# Patient Record
Sex: Female | Born: 1970
Health system: Southern US, Community
[De-identification: ages and names within clinical notes are randomized; demographics above are authoritative.]

## PROBLEM LIST (undated history)

## (undated) DIAGNOSIS — E785 Hyperlipidemia, unspecified: Secondary | ICD-10-CM

## (undated) DIAGNOSIS — M8580 Other specified disorders of bone density and structure, unspecified site: Secondary | ICD-10-CM

## (undated) DIAGNOSIS — M859 Disorder of bone density and structure, unspecified: Secondary | ICD-10-CM

## (undated) DIAGNOSIS — Z7982 Long term (current) use of aspirin: Secondary | ICD-10-CM

## (undated) DIAGNOSIS — I10 Essential (primary) hypertension: Secondary | ICD-10-CM

## (undated) DIAGNOSIS — A389 Scarlet fever, uncomplicated: Secondary | ICD-10-CM

## (undated) DIAGNOSIS — K760 Fatty (change of) liver, not elsewhere classified: Secondary | ICD-10-CM

## (undated) DIAGNOSIS — R7989 Other specified abnormal findings of blood chemistry: Secondary | ICD-10-CM

## (undated) DIAGNOSIS — I2510 Atherosclerotic heart disease of native coronary artery without angina pectoris: Secondary | ICD-10-CM

## (undated) DIAGNOSIS — M2041 Other hammer toe(s) (acquired), right foot: Secondary | ICD-10-CM

## (undated) DIAGNOSIS — M810 Age-related osteoporosis without current pathological fracture: Secondary | ICD-10-CM

## (undated) DIAGNOSIS — M21619 Bunion of unspecified foot: Secondary | ICD-10-CM

## (undated) DIAGNOSIS — M2011 Hallux valgus (acquired), right foot: Secondary | ICD-10-CM

## (undated) DIAGNOSIS — I5022 Chronic systolic (congestive) heart failure: Secondary | ICD-10-CM

## (undated) DIAGNOSIS — Z7901 Long term (current) use of anticoagulants: Secondary | ICD-10-CM

## (undated) DIAGNOSIS — Q969 Turner's syndrome, unspecified: Secondary | ICD-10-CM

## (undated) DIAGNOSIS — K8020 Calculus of gallbladder without cholecystitis without obstruction: Secondary | ICD-10-CM

## (undated) DIAGNOSIS — M9689 Other intraoperative and postprocedural complications and disorders of the musculoskeletal system: Secondary | ICD-10-CM

## (undated) DIAGNOSIS — R7303 Prediabetes: Secondary | ICD-10-CM

## (undated) HISTORY — PX: BREAST REDUCTION SURGERY: SHX8

## (undated) HISTORY — PX: HIP FRACTURE SURGERY: SHX118

---

## 2000-02-19 DIAGNOSIS — S72009A Fracture of unspecified part of neck of unspecified femur, initial encounter for closed fracture: Secondary | ICD-10-CM

## 2000-02-19 HISTORY — DX: Fracture of unspecified part of neck of unspecified femur, initial encounter for closed fracture: S72.009A

## 2006-06-04 ENCOUNTER — Encounter: Admission: RE | Admit: 2006-06-04 | Discharge: 2006-06-04 | Payer: Self-pay | Admitting: Obstetrics & Gynecology

## 2019-01-19 DIAGNOSIS — I219 Acute myocardial infarction, unspecified: Secondary | ICD-10-CM

## 2019-01-19 HISTORY — DX: Acute myocardial infarction, unspecified: I21.9

## 2019-04-30 ENCOUNTER — Ambulatory Visit: Payer: BC Managed Care – PPO | Attending: Internal Medicine

## 2019-04-30 DIAGNOSIS — Z23 Encounter for immunization: Secondary | ICD-10-CM

## 2019-04-30 NOTE — Progress Notes (Signed)
   Covid-19 Vaccination Clinic  Name:  Penny Nash    MRN: [id number] DOB: 06-05-1970  04/30/2019  Penny Nash was observed post Covid-19 immunization for 15 minutes without incident. She was provided with Vaccine Information Sheet and instruction to access the V-Safe system.   Penny Nash was instructed to call 911 with any severe reactions post vaccine: Marland Kitchen Difficulty breathing  . Swelling of face and throat  . A fast heartbeat  . A bad rash all over body  . Dizziness and weakness   Immunizations Administered    Name Date Dose VIS Date Route   Pfizer COVID-19 Vaccine 04/30/2019  9:59 AM 0.3 mL 01/29/2019 Intramuscular   Manufacturer: [employer], [employer]   Lot: [id number]   NDC: [address]-7

## 2019-05-24 ENCOUNTER — Ambulatory Visit: Payer: BC Managed Care – PPO | Attending: Internal Medicine

## 2019-05-24 DIAGNOSIS — Z23 Encounter for immunization: Secondary | ICD-10-CM

## 2019-05-24 NOTE — Progress Notes (Signed)
   Covid-19 Vaccination Clinic  Name:  Penny Nash    MRN: [id number] DOB: [birth year]  05/24/2019  [name] was observed post Covid-19 immunization for 15 minutes without incident. She was provided with Vaccine Information Sheet and instruction to access the V-Safe system.   [name] was instructed to call 911 with any severe reactions post vaccine: Marland Kitchen Difficulty breathing  . Swelling of face and throat  . A fast heartbeat  . A bad rash all over body  . Dizziness and weakness   Immunizations Administered    Name Date Dose VIS Date Route   Pfizer COVID-19 Vaccine 05/24/2019  1:43 PM 0.3 mL 01/29/2019 Intramuscular   Manufacturer: [employer], [employer]   Lot: [id number]   NDC: [address]-9

## 2020-01-19 DIAGNOSIS — I255 Ischemic cardiomyopathy: Secondary | ICD-10-CM

## 2020-01-19 HISTORY — DX: Ischemic cardiomyopathy: I25.5

## 2020-02-11 ENCOUNTER — Inpatient Hospital Stay (HOSPITAL_COMMUNITY)
Admission: EM | Disposition: A | Discharge status: Home / Self Care | Payer: Self-pay | Source: Home / Self Care | Attending: Interventional Cardiology

## 2020-02-11 ENCOUNTER — Emergency Department (HOSPITAL_COMMUNITY): Payer: BC Managed Care – PPO

## 2020-02-11 ENCOUNTER — Inpatient Hospital Stay (HOSPITAL_COMMUNITY)
Admission: EM | Admit: 2020-02-11 | Discharge: 2020-02-14 | DRG: 247 | Disposition: A | Discharge status: Home / Self Care | Payer: BC Managed Care – PPO | Attending: Interventional Cardiology | Admitting: Interventional Cardiology

## 2020-02-11 ENCOUNTER — Encounter (HOSPITAL_COMMUNITY): Payer: Self-pay | Admitting: Obstetrics and Gynecology

## 2020-02-11 ENCOUNTER — Other Ambulatory Visit: Payer: Self-pay

## 2020-02-11 DIAGNOSIS — I5022 Chronic systolic (congestive) heart failure: Secondary | ICD-10-CM

## 2020-02-11 DIAGNOSIS — I213 ST elevation (STEMI) myocardial infarction of unspecified site: Secondary | ICD-10-CM | POA: Diagnosis present

## 2020-02-11 DIAGNOSIS — E785 Hyperlipidemia, unspecified: Secondary | ICD-10-CM | POA: Diagnosis present

## 2020-02-11 DIAGNOSIS — Z20822 Contact with and (suspected) exposure to covid-19: Secondary | ICD-10-CM | POA: Diagnosis present

## 2020-02-11 DIAGNOSIS — I255 Ischemic cardiomyopathy: Secondary | ICD-10-CM | POA: Diagnosis present

## 2020-02-11 DIAGNOSIS — Q969 Turner's syndrome, unspecified: Secondary | ICD-10-CM

## 2020-02-11 DIAGNOSIS — I2109 ST elevation (STEMI) myocardial infarction involving other coronary artery of anterior wall: Principal | ICD-10-CM | POA: Diagnosis present

## 2020-02-11 DIAGNOSIS — I2510 Atherosclerotic heart disease of native coronary artery without angina pectoris: Secondary | ICD-10-CM | POA: Diagnosis present

## 2020-02-11 DIAGNOSIS — I2102 ST elevation (STEMI) myocardial infarction involving left anterior descending coronary artery: Secondary | ICD-10-CM

## 2020-02-11 DIAGNOSIS — Z955 Presence of coronary angioplasty implant and graft: Secondary | ICD-10-CM

## 2020-02-11 HISTORY — PX: LEFT HEART CATH AND CORONARY ANGIOGRAPHY: CATH118249

## 2020-02-11 HISTORY — DX: ST elevation (STEMI) myocardial infarction involving other coronary artery of anterior wall: I21.09

## 2020-02-11 HISTORY — DX: Scarlet fever, uncomplicated: A38.9

## 2020-02-11 HISTORY — DX: Other specified disorders of bone density and structure, unspecified site: M85.80

## 2020-02-11 HISTORY — DX: Disorder of bone density and structure, unspecified: M85.9

## 2020-02-11 HISTORY — PX: CORONARY/GRAFT ACUTE MI REVASCULARIZATION: CATH118305

## 2020-02-11 HISTORY — PX: INTRAVASCULAR ULTRASOUND/IVUS: CATH118244

## 2020-02-11 HISTORY — PX: CORONARY ULTRASOUND/IVUS: CATH118244

## 2020-02-11 HISTORY — DX: Turner's syndrome, unspecified: Q96.9

## 2020-02-11 LAB — PROTIME-INR
INR: 1 (ref 0.8–1.2)
Prothrombin Time: 13 seconds (ref 11.4–15.2)

## 2020-02-11 LAB — LIPID PANEL
Cholesterol: 207 mg/dL — ABNORMAL HIGH (ref 0–200)
HDL: 71 mg/dL (ref >40)
LDL Cholesterol: 128 mg/dL — ABNORMAL HIGH (ref 0–99)
Total CHOL/HDL Ratio: 2.9 RATIO
Triglycerides: 42 mg/dL (ref <150)
VLDL: 8 mg/dL (ref 0–40)

## 2020-02-11 LAB — MRSA PCR SCREENING: MRSA by PCR: NEGATIVE

## 2020-02-11 LAB — RESP PANEL BY RT-PCR (FLU A&B, COVID) ARPGX2
Influenza A by PCR: NEGATIVE
Influenza B by PCR: NEGATIVE
SARS Coronavirus 2 by RT PCR: NEGATIVE

## 2020-02-11 LAB — CBC
HCT: 44.8 % (ref 36.0–46.0)
Hemoglobin: 15.2 g/dL — ABNORMAL HIGH (ref 12.0–15.0)
MCH: 31.6 pg (ref 26.0–34.0)
MCHC: 33.9 g/dL (ref 30.0–36.0)
MCV: 93.1 fL (ref 80.0–100.0)
Platelets: 281 10*3/uL (ref 150–400)
RBC: 4.81 MIL/uL (ref 3.87–5.11)
RDW: 12.1 % (ref 11.5–15.5)
WBC: 17.6 10*3/uL — ABNORMAL HIGH (ref 4.0–10.5)
nRBC: 0 % (ref 0.0–0.2)

## 2020-02-11 LAB — BASIC METABOLIC PANEL
Anion gap: 11 (ref 5–15)
BUN: 12 mg/dL (ref 6–20)
CO2: 26 mmol/L (ref 22–32)
Calcium: 10 mg/dL (ref 8.9–10.3)
Chloride: 101 mmol/L (ref 98–111)
Creatinine, Ser: 0.76 mg/dL (ref 0.44–1.00)
GFR, Estimated: >60 mL/min (ref >60)
Glucose, Bld: 169 mg/dL — ABNORMAL HIGH (ref 70–99)
Potassium: 3.9 mmol/L (ref 3.5–5.1)
Sodium: 138 mmol/L (ref 135–145)

## 2020-02-11 LAB — HEMOGLOBIN A1C
Hgb A1c MFr Bld: 5.6 % (ref 4.8–5.6)
Mean Plasma Glucose: 114.02 mg/dL

## 2020-02-11 LAB — TROPONIN I (HIGH SENSITIVITY)
Troponin I (High Sensitivity): 13067 ng/L (ref <18)
Troponin I (High Sensitivity): 15101 ng/L (ref <18)

## 2020-02-11 LAB — APTT: aPTT: 26 seconds (ref 24–36)

## 2020-02-11 SURGERY — CORONARY/GRAFT ACUTE MI REVASCULARIZATION
Anesthesia: LOCAL

## 2020-02-11 MED ORDER — TIROFIBAN HCL IN NACL 5-0.9 MG/100ML-% IV SOLN: INTRAVENOUS | Status: DC | PRN

## 2020-02-11 MED ORDER — ATORVASTATIN CALCIUM 80 MG PO TABS
80.0000 mg | ORAL_TABLET | Freq: Every day | ORAL | Status: DC
  Administered 2020-02-11 – 2020-02-14 (×4): 80 mg via ORAL
  Filled 2020-02-11 (×4): qty 1

## 2020-02-11 MED ORDER — NITROGLYCERIN 0.4 MG SL SUBL
0.4000 mg | SUBLINGUAL_TABLET | SUBLINGUAL | Status: DC | PRN
  Administered 2020-02-11: 0.4 mg via SUBLINGUAL
  Filled 2020-02-11: qty 1

## 2020-02-11 MED ORDER — HEPARIN (PORCINE) IN NACL 1000-0.9 UT/500ML-% IV SOLN
INTRAVENOUS | Status: AC
  Filled 2020-02-11: qty 1000

## 2020-02-11 MED ORDER — VERAPAMIL HCL 2.5 MG/ML IV SOLN
INTRAVENOUS | Status: DC | PRN
  Administered 2020-02-11: 17:00:00 10 mL via INTRA_ARTERIAL

## 2020-02-11 MED ORDER — ACETAMINOPHEN 325 MG PO TABS: 650.0000 mg | ORAL_TABLET | ORAL | Status: DC | PRN

## 2020-02-11 MED ORDER — HEPARIN SODIUM (PORCINE) 1000 UNIT/ML IJ SOLN
INTRAMUSCULAR | Status: AC
  Filled 2020-02-11: qty 1

## 2020-02-11 MED ORDER — VERAPAMIL HCL 2.5 MG/ML IV SOLN
INTRAVENOUS | Status: AC
  Filled 2020-02-11: qty 2

## 2020-02-11 MED ORDER — TIROFIBAN (AGGRASTAT) BOLUS VIA INFUSION
INTRAVENOUS | Status: DC | PRN
  Administered 2020-02-11: 18:00:00 1360 ug via INTRAVENOUS

## 2020-02-11 MED ORDER — IOHEXOL 350 MG/ML SOLN
INTRAVENOUS | Status: DC | PRN
  Administered 2020-02-11: 18:00:00 150 mL

## 2020-02-11 MED ORDER — HEPARIN SODIUM (PORCINE) 1000 UNIT/ML IJ SOLN
INTRAMUSCULAR | Status: DC | PRN
  Administered 2020-02-11: 6000 [IU] via INTRAVENOUS
  Administered 2020-02-11: 3000 [IU] via INTRAVENOUS

## 2020-02-11 MED ORDER — SODIUM CHLORIDE 0.9 % IV SOLN: 250.0000 mL | INTRAVENOUS | Status: DC | PRN

## 2020-02-11 MED ORDER — ASPIRIN 81 MG PO CHEW
81.0000 mg | CHEWABLE_TABLET | Freq: Every day | ORAL | Status: DC
  Administered 2020-02-12 – 2020-02-14 (×3): 81 mg via ORAL
  Filled 2020-02-11 (×3): qty 1

## 2020-02-11 MED ORDER — HEPARIN SODIUM (PORCINE) 5000 UNIT/ML IJ SOLN
60.0000 [IU]/kg | Freq: Once | INTRAMUSCULAR | Status: AC
  Administered 2020-02-11: 3250 [IU] via INTRAVENOUS
  Filled 2020-02-11: qty 1

## 2020-02-11 MED ORDER — LIDOCAINE HCL (PF) 1 % IJ SOLN
INTRAMUSCULAR | Status: DC | PRN
  Administered 2020-02-11: 5 mL

## 2020-02-11 MED ORDER — HYDRALAZINE HCL 20 MG/ML IJ SOLN: 10.0000 mg | INTRAMUSCULAR | Status: DC | PRN

## 2020-02-11 MED ORDER — HEPARIN (PORCINE) IN NACL 1000-0.9 UT/500ML-% IV SOLN
INTRAVENOUS | Status: DC | PRN
  Administered 2020-02-11 (×2): 500 mL

## 2020-02-11 MED ORDER — SODIUM CHLORIDE 0.9% FLUSH
3.0000 mL | Freq: Two times a day (BID) | INTRAVENOUS | Status: DC
  Administered 2020-02-11 – 2020-02-14 (×6): 3 mL via INTRAVENOUS

## 2020-02-11 MED ORDER — FENTANYL CITRATE (PF) 100 MCG/2ML IJ SOLN
INTRAMUSCULAR | Status: AC
  Filled 2020-02-11: qty 2

## 2020-02-11 MED ORDER — MIDAZOLAM HCL 2 MG/2ML IJ SOLN
INTRAMUSCULAR | Status: AC
  Filled 2020-02-11: qty 2

## 2020-02-11 MED ORDER — TICAGRELOR 90 MG PO TABS
ORAL_TABLET | ORAL | Status: AC
  Filled 2020-02-11: qty 2

## 2020-02-11 MED ORDER — SODIUM CHLORIDE 0.9 % IV SOLN: INTRAVENOUS | Status: DC

## 2020-02-11 MED ORDER — ASPIRIN 81 MG PO CHEW
324.0000 mg | CHEWABLE_TABLET | Freq: Once | ORAL | Status: AC
  Administered 2020-02-11: 324 mg via ORAL
  Filled 2020-02-11: qty 4

## 2020-02-11 MED ORDER — TICAGRELOR 90 MG PO TABS
90.0000 mg | ORAL_TABLET | Freq: Two times a day (BID) | ORAL | Status: DC
  Administered 2020-02-12 – 2020-02-14 (×5): 90 mg via ORAL
  Filled 2020-02-11 (×6): qty 1

## 2020-02-11 MED ORDER — SODIUM CHLORIDE 0.9% FLUSH: 3.0000 mL | INTRAVENOUS | Status: DC | PRN

## 2020-02-11 MED ORDER — FENTANYL CITRATE (PF) 100 MCG/2ML IJ SOLN
INTRAMUSCULAR | Status: DC | PRN
  Administered 2020-02-11 (×2): 25 ug via INTRAVENOUS

## 2020-02-11 MED ORDER — NITROGLYCERIN 1 MG/10 ML FOR IR/CATH LAB
INTRA_ARTERIAL | Status: DC | PRN
  Administered 2020-02-11 (×2): 200 ug

## 2020-02-11 MED ORDER — ONDANSETRON HCL 4 MG/2ML IJ SOLN: 4.0000 mg | Freq: Four times a day (QID) | INTRAMUSCULAR | Status: DC | PRN

## 2020-02-11 MED ORDER — LABETALOL HCL 5 MG/ML IV SOLN: 10.0000 mg | INTRAVENOUS | Status: DC | PRN

## 2020-02-11 MED ORDER — MIDAZOLAM HCL 2 MG/2ML IJ SOLN
INTRAMUSCULAR | Status: DC | PRN
  Administered 2020-02-11 (×2): 1 mg via INTRAVENOUS

## 2020-02-11 MED ORDER — LIDOCAINE HCL (PF) 1 % IJ SOLN
INTRAMUSCULAR | Status: AC
  Filled 2020-02-11: qty 30

## 2020-02-11 MED ORDER — METOPROLOL TARTRATE 12.5 MG HALF TABLET
12.5000 mg | ORAL_TABLET | Freq: Two times a day (BID) | ORAL | Status: DC
  Administered 2020-02-11 – 2020-02-12 (×3): 12.5 mg via ORAL
  Filled 2020-02-11 (×3): qty 1

## 2020-02-11 MED ORDER — TIROFIBAN HCL IN NACL 5-0.9 MG/100ML-% IV SOLN
INTRAVENOUS | Status: AC
  Filled 2020-02-11: qty 100

## 2020-02-11 MED ORDER — TICAGRELOR 90 MG PO TABS
ORAL_TABLET | ORAL | Status: DC | PRN
  Administered 2020-02-11: 180 mg via ORAL

## 2020-02-11 SURGICAL SUPPLY — 26 items
BALLN SAPPHIRE 2.5X12 (BALLOONS) ×2
BALLN SAPPHIRE ~~LOC~~ 2.75X12 (BALLOONS) ×1 IMPLANT
BALLN SAPPHIRE ~~LOC~~ 3.75X8 (BALLOONS) ×1 IMPLANT
BALLN WOLVERINE 2.00X15 (BALLOONS) ×2
BALLOON SAPPHIRE 2.5X12 (BALLOONS) IMPLANT
BALLOON WOLVERINE 2.00X15 (BALLOONS) IMPLANT
CATH 5FR JL3.5 JR4 ANG PIG MP (CATHETERS) ×1 IMPLANT
CATH EXTRAC PRONTO 5.5F 138CM (CATHETERS) ×1 IMPLANT
CATH LAUNCHER 6FR EBU 3 (CATHETERS) ×1 IMPLANT
CATH LAUNCHER 6FR EBU3.5 (CATHETERS) ×1 IMPLANT
CATH OPTICROSS HD (CATHETERS) ×1 IMPLANT
DEVICE RAD COMP TR BAND LRG (VASCULAR PRODUCTS) ×1 IMPLANT
GUIDEWIRE INQWIRE 1.5J.035X260 (WIRE) IMPLANT
INQWIRE 1.5J .035X260CM (WIRE) ×4
KIT HEART LEFT (KITS) ×2 IMPLANT
PACK CARDIAC CATHETERIZATION (CUSTOM PROCEDURE TRAY) ×2 IMPLANT
SHEATH PROBE COVER 6X72 (BAG) ×1 IMPLANT
SLED PULL BACK IVUS (MISCELLANEOUS) ×1 IMPLANT
STENT SYNERGY XD 2.25X24 (Permanent Stent) IMPLANT
STENT SYNERGY XD 3.50X12 (Permanent Stent) IMPLANT
SYNERGY XD 2.25X24 (Permanent Stent) ×2 IMPLANT
SYNERGY XD 3.50X12 (Permanent Stent) ×2 IMPLANT
TRANSDUCER W/STOPCOCK (MISCELLANEOUS) ×2 IMPLANT
TUBING CIL FLEX 10 FLL-RA (TUBING) ×2 IMPLANT
WIRE ASAHI PROWATER 180CM (WIRE) ×1 IMPLANT
WIRE HI TORQ BMW 190CM (WIRE) ×1 IMPLANT

## 2020-02-11 NOTE — ED Notes (Signed)
Code Stemi called @ 16:18 by Deatra Robinson.

## 2020-02-11 NOTE — ED Notes (Signed)
Critical troponin: 15,101 called by lab. [facility]. Lab values pushed to IP at this time.

## 2020-02-11 NOTE — H&P (Addendum)
Cardiology Admission History and Physical:   Patient ID: Penny Nash MRN: [id number]; DOB: 10/21/1970   Admission date: 02/11/2020  Primary Care Provider: Macy Mis, MD [facility] Cardiologist: No primary care provider on file. 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  It has been fairly constant but better now than it was earlier.  She came to the emergency room.  Initial ECG did not concern them for STEMI.  Her troponin came back and repeat ECG showed worsening ST segments.  Code STEMI was called.  Her pain actually improved over time.  She has been given some sublingual nitroglycerin.  She has not had any family history of coronary artery disease.  Overall, she is quite healthy.   Past Medical History:  Diagnosis Date  . Hip fracture (HCC) 2002  . Turner syndrome       Medications Prior to Admission: Prior to Admission medications   Not on File     Allergies:   No Known Allergies  Social History:   Social History   Socioeconomic History  . Marital status: Married    Spouse name: Not on file  . Number of children: Not on file  . Years of education: Not on file  . Highest education level: Not on file  Occupational History  . Not on file  Tobacco Use  . Smoking status: Never Smoker  . Smokeless tobacco: Never Used  Vaping Use  . Vaping Use: Never used  Substance and Sexual Activity  . Alcohol use: Yes    Comment: Social  . Drug use: Not Currently  . Sexual activity: Yes  Other Topics Concern  . Not on file  Social History Narrative  . Not on file   Social Determinants of Health   Financial Resource Strain: Not on file  Food Insecurity: Not on file  Transportation Needs: Not on file  Physical Activity: Not on file  Stress: Not  on file  Social Connections: Not on file  Intimate Partner Violence: Not on file    Family History:   The patient's family history is not on file.  Sister with mitral valve repair  ROS:  Please see the history of present illness.  Chest discomfort, 2 out of 10.  All other ROS reviewed and negative.     Physical Exam/Data:   Vitals:   02/11/20 1818 02/11/20 1823 02/11/20 1833 02/11/20 1842  BP: 119/85 118/83  104/85  Pulse: 80 78 (!) 0   Resp: 20 15 (!) 0 20  Temp:      TempSrc:      SpO2: 99% 100% (!) 0%   Weight:      Height:       No intake or output data in the 24 hours ending 02/11/20 1854 Last 3 Weights 02/11/2020  Weight (lbs) 120 lb  Weight (kg) 54.432 kg     Body mass index is 25.97 kg/m.  General:  Well nourished, well developed, in no acute distress HEENT: normal Lymph: no adenopathy Neck: no JVD Endocrine:  No thryomegaly Vascular: No carotid bruits; dorsalis pedis, radial pulses 2+ bilaterally Cardiac:  normal S1, S2; RRR; no murmur  Lungs:  clear to auscultation bilaterally, no wheezing, rhonchi or rales  Abd: soft, nontender, no hepatomegaly  Ext:  no edema Musculoskeletal:  No deformities, BUE and BLE strength normal and equal Skin: warm and dry  Neuro:  CNs 2-12 intact, no focal abnormalities noted Psych:  Normal affect    EKG:  The ECG that was done today was personally reviewed and demonstrates normal sinus rhythm with anterolateral ST elevation and inferior ST reciprocal depression  Relevant CV Studies:   Laboratory Data:  High Sensitivity Troponin:   Recent Labs  Lab 02/11/20 1500 02/11/20 1617  TROPONINIHS 13,067* 15,101*      Chemistry Recent Labs  Lab 02/11/20 1500  NA 138  K 3.9  CL 101  CO2 26  GLUCOSE 169*  BUN 12  CREATININE 0.76  CALCIUM 10.0  GFRNONAA >60  ANIONGAP 11    No results for input(s): PROT, ALBUMIN, AST, ALT, ALKPHOS, BILITOT in the last 168 hours. Hematology Recent Labs  Lab 02/11/20 1500  WBC  17.6*  RBC 4.81  HGB 15.2*  HCT 44.8  MCV 93.1  MCH 31.6  MCHC 33.9  RDW 12.1  PLT 281   BNPNo results for input(s): BNP, PROBNP in the last 168 hours.  DDimer No results for input(s): DDIMER in the last 168 hours.   Radiology/Studies:  DG Chest 2 View  Result Date: 02/11/2020 CLINICAL DATA:  Chest pain, arm tingling EXAM: CHEST - 2 VIEW COMPARISON:  X-ray thoracic spine 06/10/2014 FINDINGS: The heart size and mediastinal contours are within normal limits. No focal consolidation. No pulmonary edema. No pleural effusion. No pneumothorax. No acute osseous abnormality. 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 IMPRESSION: 1. No active cardiopulmonary disease. 2. 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 Recommend correlation with tenderness to palpation to evaluate for an underlying acute component. Electronically Signed   By: Tish Frederickson M.D.   On: 02/11/2020 15:07     Assessment and Plan:   1. Acute anterior MI: Plan for emergent cardiac catheterization.  She will need aggressive risk factor modification.  High-dose statin and beta-blocker.  She will need to be checked for diabetes as well.  Further plans based on cath result.  Some concern for aortic dissection given her Turner syndrome.  No murmur indicative of bicuspid aortic valve.  Equal pulses in both feet and both arms.  No clinical evidence of dissection and with elevated troponin, this seems more like ACS.  Addendum post cath: Patient with occluded mid LAD.  She had separate ostia of the LAD and circumflex.  Mid LAD was stented.  The distal LAD was diffusely diseased and calcified and a small vessel by intravascular ultrasound.  Proximal to ostial LAD was stented for more chronic lesion.  Large left circumflex with mild diffuse disease.  Small RCA.  Will check echocardiogram to evaluate LVEF.  She did not appear volume overloaded.   Anticipate she will be in the hospital for a couple of days.      TIMI Risk Score for ST  Elevation MI:   The patient's TIMI risk score is 5, which indicates a 12.4% risk of all cause mortality at 30 days.       Severity of Illness: The appropriate patient status for this patient is INPATIENT. Inpatient status is judged to be reasonable and necessary in order to provide the required intensity of service to ensure the patient's safety. The patient's presenting symptoms, physical exam findings, and initial radiographic and laboratory data in the context of their chronic comorbidities is felt to place them  at high risk for further clinical deterioration. Furthermore, it is not anticipated that the patient will be medically stable for discharge from the hospital within 2 midnights of admission. The following factors support the patient status of inpatient.   " The patient's presenting symptoms include chest pain. " The worrisome physical exam findings include n/a. " The initial radiographic and laboratory data are worrisome because of abnormal ECG. " The chronic co-morbidities include turners syndrome.   * I certify that at the point of admission it is my clinical judgment that the patient will require inpatient hospital care spanning beyond 2 midnights from the point of admission due to high intensity of service, high risk for further deterioration and high frequency of surveillance required.*    For questions or updates, please contact [facility] Please consult [email or web address] for contact info under     Signed, Lance Muss, MD  02/11/2020 6:54 PM

## 2020-02-11 NOTE — ED Triage Notes (Signed)
Patient reports that last night she started having chest tightness and "felt strange" and then she had tingling going down her right arm and up her jaw and had an episode of emesis.

## 2020-02-11 NOTE — ED Notes (Signed)
[facility] at bedside, report with no further questions.

## 2020-02-11 NOTE — ED Notes (Signed)
After NTG administration pt chest pain 6/10 down to 2/10.

## 2020-02-11 NOTE — ED Provider Notes (Signed)
[place] DEPT Provider Note   CSN: [id number] Arrival date & time: 02/11/20  1434     History Chief Complaint  Patient presents with  . Chest Pain    Penny Nash is a 49 y.o. female.  The history is provided by the patient.  Chest Pain Pain location:  R chest and substernal area Pain quality: aching, pressure and radiating   Pain radiates to:  R shoulder Pain severity:  Moderate Onset quality:  Gradual Duration:  7 hours Timing:  Constant Progression:  Unchanged Chronicity:  New Context comment:  Started before laying down last night to go to bed Relieved by:  Nothing Worsened by:  Nothing Ineffective treatments:  Antacids and aspirin Associated symptoms: anorexia, nausea and vomiting   Associated symptoms: no abdominal pain, no back pain, no cough, no diaphoresis, no fever, no headache, no heartburn, no palpitations and no shortness of breath   Associated symptoms comment:  Tingling in the right arm Risk factors comment:  Hx of turner's syndrome.  no tobacco, drug or alcohol use.  no family hx of early CAD      Past Medical History:  Diagnosis Date  . Hip fracture ([place]) 2002  . Turner syndrome     There are no problems to display for this patient.      OB History   No obstetric history on file.     No family history on file.  Social History   Tobacco Use  . Smoking status: Never Smoker  . Smokeless tobacco: Never Used  Vaping Use  . Vaping Use: Never used  Substance Use Topics  . Alcohol use: Yes    Comment: Social  . Drug use: Not Currently    Home Medications Prior to Admission medications   Not on File    Allergies    Patient has no allergy information on record.  Review of Systems   Review of Systems  Constitutional: Negative for diaphoresis and fever.  Respiratory: Negative for cough and shortness of breath.   Cardiovascular: Positive for chest pain. Negative for palpitations.   Gastrointestinal: Positive for anorexia, nausea and vomiting. Negative for abdominal pain and heartburn.  Musculoskeletal: Negative for back pain.  Neurological: Negative for headaches.  All other systems reviewed and are negative.   Physical Exam Updated Vital Signs BP (!) 137/95 (BP Location: Right Arm)   Pulse 73   Temp 98.1 F (36.7 C) (Oral)   Resp 16   SpO2 96%   Physical Exam Vitals and nursing note reviewed.  Constitutional:      General: She is not in acute distress.    Appearance: She is well-developed, normal weight and well-nourished.  HENT:     Head: Normocephalic and atraumatic.     Mouth/Throat:     Mouth: Oropharynx is clear and moist.  Eyes:     Extraocular Movements: EOM normal.     Conjunctiva/sclera: Conjunctivae normal.     Pupils: Pupils are equal, round, and reactive to light.  Cardiovascular:     Rate and Rhythm: Normal rate and regular rhythm.     Pulses: Normal pulses and intact distal pulses.     Heart sounds: Normal heart sounds. No murmur heard.   Pulmonary:     Effort: Pulmonary effort is normal. No respiratory distress.     Breath sounds: Normal breath sounds. No wheezing or rales.  Abdominal:     General: There is no distension.     Palpations: Abdomen is soft.  Tenderness: There is no abdominal tenderness. There is no guarding or rebound.  Musculoskeletal:        General: No tenderness. Normal range of motion.     Cervical back: Normal range of motion and neck supple.     Right lower leg: No edema.     Left lower leg: No edema.  Skin:    General: Skin is warm and dry.     Findings: No erythema or rash.  Neurological:     General: No focal deficit present.     Mental Status: She is alert and oriented to person, place, and time. Mental status is at baseline.  Psychiatric:        Mood and Affect: Mood and affect and mood normal.        Behavior: Behavior normal.        Thought Content: Thought content normal.      ED  Results / Procedures / Treatments   Labs (all labs ordered are listed, but only abnormal results are displayed) Labs Reviewed  BASIC METABOLIC PANEL - Abnormal; Notable for the following components:      Result Value   Glucose, Bld 169 (*)    All other components within normal limits  CBC - Abnormal; Notable for the following components:   WBC 17.6 (*)    Hemoglobin 15.2 (*)    All other components within normal limits  TROPONIN I (HIGH SENSITIVITY) - Abnormal; Notable for the following components:   Troponin I (High Sensitivity) 13,067 (*)    All other components within normal limits    EKG EKG Interpretation  Date/Time:  Friday February 11 2020 14:46:52 EST Ventricular Rate:  78 PR Interval:    QRS Duration: 90 QT Interval:  384 QTC Calculation: 438 R Axis:   103 Text Interpretation: Sinus rhythm Probable lateral infarct, old Probable anteroseptal infarct, recent 12 Lead; Mason-Likar Confirmed by Blanchie Dessert [phone]) on 02/11/2020 3:52:59 PM   Radiology DG Chest 2 View  Result Date: 02/11/2020 CLINICAL DATA:  Chest pain, arm tingling EXAM: CHEST - 2 VIEW COMPARISON:  X-ray thoracic spine 06/10/2014 FINDINGS: The heart size and mediastinal contours are within normal limits. No focal consolidation. No pulmonary edema. No pleural effusion. No pneumothorax. No acute osseous abnormality. 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 IMPRESSION: 1. No active cardiopulmonary disease. 2. 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 Recommend correlation with tenderness to palpation to evaluate for an underlying acute component. Electronically Signed   By: Iven Finn M.D.   On: 02/11/2020 15:07    Procedures Procedures (including critical care time)  Medications Ordered in ED Medications  aspirin chewable tablet 324 mg (has no administration in time range)  nitroGLYCERIN (NITROSTAT) SL  tablet 0.4 mg (has no administration in time range)    ED Course  I have reviewed the triage vital signs and the nursing notes.  Pertinent labs & imaging results that were available during my care of the patient were reviewed by me and considered in my medical decision making (see chart for details).    MDM Rules/Calculators/A&P                         Patient is a 49 year old female with no significant past medical history except for Turner syndrome who is presenting today with chest pain radiating into the right arm that started at 9 PM last night.  She reports  all night long it was uncomfortable and she was unable to go to sleep.  She did try Tums which caused her to vomit but did not help with the pain.  All day today she has had persistent chest discomfort that does not seem to be worse with any particular activity.  It is a 5 out of 10.  It does not radiate into her abdomen or her back.  She denies any shortness of breath or infectious symptoms.  Patient is still currently having 5 out of 10 pain.  She is well-appearing on exam vital signs are reassuring.  However patient's EKG is concerning with ST elevation of approximately 1 mm in lateral leads I and AV L as well as ST depression in inferior and anterior leads.  Patient intends are concerning for ACS.  No prior history of GI pathology.  Chest x-ray without acute findings.  Leukocytosis of 17,000 but normal hemoglobin.  BMP without acute findings.  Troponin elevated today at 13,000.  Patient given 325 of aspirin, nitroglycerin ordered due to ongoing pain.  Will repeat EKG and discuss with cardiology.  Patient was bedded in room at 345.  Initial EKG done was done at 2:46 PM.  Initial doctor evaluating the EKG did not feel that it met STEMI criteria.  I evaluated the EKG at 408 and felt EKG was concerning.  Labs then returned shortly after.  Will contact STEMI doc.  4:16 PM Repeat EKG is concerning for acute MI with ST elevation in 1 and aVL as  well as V2 with ST depression in inferior anterior/lateral leads.  STEMI activated.  Patient also given heparin.  Will discuss with cardiology and plan on transfer to [facility].  MDM Number of Diagnoses or Management Options   Amount and/or Complexity of Data Reviewed Clinical lab tests: ordered and reviewed Tests in the radiology section of CPT: ordered and reviewed Tests in the medicine section of CPT: ordered and reviewed Decide to obtain previous medical records or to obtain history from someone other than the patient: yes Obtain history from someone other than the patient: no Review and summarize past medical records: yes Discuss the patient with other providers: yes Independent visualization of images, tracings, or specimens: yes  Risk of Complications, Morbidity, and/or Mortality Presenting problems: high Diagnostic procedures: high Management options: high  Patient Progress Patient progress: stable  CRITICAL CARE Performed by: Keilen Kahl Total critical care time: 30 minutes Critical care time was exclusive of separately billable procedures and treating other patients. Critical care was necessary to treat or prevent imminent or life-threatening deterioration. Critical care was time spent personally by me on the following activities: development of treatment plan with patient and/or surrogate as well as nursing, discussions with consultants, evaluation of patient's response to treatment, examination of patient, obtaining history from patient or surrogate, ordering and performing treatments and interventions, ordering and review of laboratory studies, ordering and review of radiographic studies, pulse oximetry and re-evaluation of patient's condition.   Final Clinical Impression(s) / ED Diagnoses Final diagnoses:  ST elevation myocardial infarction (STEMI), unspecified artery [facility])    Rx / DC Orders ED Discharge Orders    None       Blanchie Dessert, MD 02/11/20  [phone]

## 2020-02-11 NOTE — ED Notes (Signed)
Date and time results received: 02/11/20 4:00 PM (use smartphrase ".now" to insert current time)  Test: troponin Critical Value: 13067.00  Name of Provider Notified: .[email or web address]  Orders Received? Or Actions Taken?:

## 2020-02-12 ENCOUNTER — Encounter (HOSPITAL_COMMUNITY): Payer: Self-pay | Admitting: Interventional Cardiology

## 2020-02-12 ENCOUNTER — Inpatient Hospital Stay (HOSPITAL_COMMUNITY): Payer: BC Managed Care – PPO

## 2020-02-12 DIAGNOSIS — Q969 Turner's syndrome, unspecified: Secondary | ICD-10-CM | POA: Diagnosis not present

## 2020-02-12 DIAGNOSIS — I2109 ST elevation (STEMI) myocardial infarction involving other coronary artery of anterior wall: Secondary | ICD-10-CM

## 2020-02-12 LAB — TROPONIN I (HIGH SENSITIVITY)
Troponin I (High Sensitivity): 19315 ng/L (ref <18)
Troponin I (High Sensitivity): 17940 ng/L (ref <18)

## 2020-02-12 LAB — BASIC METABOLIC PANEL
Anion gap: 10 (ref 5–15)
Anion gap: 12 (ref 5–15)
BUN: 10 mg/dL (ref 6–20)
BUN: 12 mg/dL (ref 6–20)
CO2: 25 mmol/L (ref 22–32)
CO2: 23 mmol/L (ref 22–32)
Calcium: 9 mg/dL (ref 8.9–10.3)
Calcium: 8.9 mg/dL (ref 8.9–10.3)
Chloride: 103 mmol/L (ref 98–111)
Chloride: 101 mmol/L (ref 98–111)
Creatinine, Ser: 0.73 mg/dL (ref 0.44–1.00)
Creatinine, Ser: 0.77 mg/dL (ref 0.44–1.00)
GFR, Estimated: >60 mL/min (ref >60)
GFR, Estimated: >60 mL/min (ref >60)
Glucose, Bld: 117 mg/dL — ABNORMAL HIGH (ref 70–99)
Glucose, Bld: 143 mg/dL — ABNORMAL HIGH (ref 70–99)
Potassium: 3.8 mmol/L (ref 3.5–5.1)
Potassium: 3.6 mmol/L (ref 3.5–5.1)
Sodium: 138 mmol/L (ref 135–145)
Sodium: 136 mmol/L (ref 135–145)

## 2020-02-12 LAB — CBC
HCT: 38.8 % (ref 36.0–46.0)
Hemoglobin: 13.2 g/dL (ref 12.0–15.0)
MCH: 31 pg (ref 26.0–34.0)
MCHC: 34 g/dL (ref 30.0–36.0)
MCV: 91.1 fL (ref 80.0–100.0)
Platelets: 200 10*3/uL (ref 150–400)
RBC: 4.26 MIL/uL (ref 3.87–5.11)
RDW: 12.5 % (ref 11.5–15.5)
WBC: 13.3 10*3/uL — ABNORMAL HIGH (ref 4.0–10.5)
nRBC: 0 % (ref 0.0–0.2)

## 2020-02-12 LAB — ECHOCARDIOGRAM COMPLETE
Area-P 1/2: 3.48 cm2
Calc EF: 44.3 %
Height: 57 in
S' Lateral: 2.7 cm
Single Plane A2C EF: 41 %
Single Plane A4C EF: 47.2 %
Weight: 1932.99 oz

## 2020-02-12 LAB — LIPID PANEL
Cholesterol: 162 mg/dL (ref 0–200)
HDL: 55 mg/dL (ref >40)
LDL Cholesterol: 94 mg/dL (ref 0–99)
Total CHOL/HDL Ratio: 2.9 RATIO
Triglycerides: 65 mg/dL (ref <150)
VLDL: 13 mg/dL (ref 0–40)

## 2020-02-12 LAB — HEMOGLOBIN A1C
Hgb A1c MFr Bld: 5.3 % (ref 4.8–5.6)
Mean Plasma Glucose: 105.41 mg/dL

## 2020-02-12 MED ORDER — CHLORHEXIDINE GLUCONATE CLOTH 2 % EX PADS
6.0000 | MEDICATED_PAD | Freq: Every day | CUTANEOUS | Status: DC
  Administered 2020-02-13 – 2020-02-14 (×2): 6 via TOPICAL

## 2020-02-12 NOTE — Plan of Care (Signed)

## 2020-02-12 NOTE — Progress Notes (Signed)
Cardiology Progress Note  Patient ID: Penny Nash MRN: [id number] DOB: [birth year] Date of Encounter: 02/12/2020  Primary Cardiologist: No primary care provider on file.  Subjective   Chief Complaint: No further chest pain.  HPI: Admitted with anterior wall STEMI. Status post PCI to the mid LAD. LVEDP 14. Denies chest pain. EKG with anterior Q waves and persistent ST elevation.  ROS:  All other ROS reviewed and negative. Pertinent positives noted in the HPI.     Inpatient Medications  Scheduled Meds:  aspirin  81 mg Oral Daily   atorvastatin  80 mg Oral Daily   Chlorhexidine Gluconate Cloth  6 each Topical Daily   metoprolol tartrate  12.5 mg Oral BID   sodium chloride flush  3 mL Intravenous Q12H   ticagrelor  90 mg Oral BID   Continuous Infusions:  sodium chloride     sodium chloride     PRN Meds: sodium chloride, acetaminophen, nitroGLYCERIN, ondansetron (ZOFRAN) IV, sodium chloride flush   Vital Signs   Vitals:   02/12/20 0530 02/12/20 0600 02/12/20 0700 02/12/20 0729  BP: 91/63 91/69 96/73    Pulse: 73 72 74   Resp: 18 18 19    Temp:    98.5 F (36.9 C)  TempSrc:    Oral  SpO2: 98% 98% 97%   Weight:  54.8 kg    Height:        Intake/Output Summary (Last 24 hours) at 02/12/2020 0758 Last data filed at 02/12/2020 0600 Gross per 24 hour  Intake 700 ml  Output 1150 ml  Net -450 ml   Last 3 Weights 02/12/2020 02/11/2020  Weight (lbs) 120 lb 13 oz 120 lb  Weight (kg) 54.8 kg 54.432 kg      Telemetry  Overnight telemetry shows sinus rhythm heart rate in the seventies, which I personally reviewed.   ECG  The most recent ECG shows normal sinus rhythm, anterior Q waves, persistent ST elevation, which I personally reviewed.   Physical Exam   Vitals:   02/12/20 0530 02/12/20 0600 02/12/20 0700 02/12/20 0729  BP: 91/63 91/69 96/73    Pulse: 73 72 74   Resp: 18 18 19    Temp:    98.5 F (36.9 C)  TempSrc:    Oral  SpO2: 98% 98% 97%    Weight:  54.8 kg    Height:         Intake/Output Summary (Last 24 hours) at 02/12/2020 0758 Last data filed at 02/12/2020 0600 Gross per 24 hour  Intake 700 ml  Output 1150 ml  Net -450 ml    Last 3 Weights 02/12/2020 02/11/2020  Weight (lbs) 120 lb 13 oz 120 lb  Weight (kg) 54.8 kg 54.432 kg    Body mass index is 26.14 kg/m.  General: Well nourished, well developed, in no acute distress Head: Atraumatic, normal size  Eyes: PEERLA, EOMI  Neck: Supple, no JVD Endocrine: No thryomegaly Cardiac: Normal S1, S2; RRR; no murmurs, rubs, or gallops Lungs: Clear to auscultation bilaterally, no wheezing, rhonchi or rales  Abd: Soft, nontender, no hepatomegaly  Ext: No edema, pulses 2+, right radial cath site clean and dry without hematoma or bruit Musculoskeletal: No deformities, BUE and BLE strength normal and equal Skin: Warm and dry, no rashes   Neuro: Alert and oriented to person, place, time, and situation, CNII-XII grossly intact, no focal deficits  Psych: Normal mood and affect   Labs  High Sensitivity Troponin:   Recent Labs  Lab 02/11/20 1500 02/11/20 1617  TROPONINIHS 13,067* 15,101*     Cardiac EnzymesNo results for input(s): TROPONINI in the last 168 hours. No results for input(s): TROPIPOC in the last 168 hours.  Chemistry Recent Labs  Lab 02/11/20 1500 02/12/20 0328  NA 138 138  K 3.9 3.8  CL 101 103  CO2 26 25  GLUCOSE 169* 117*  BUN 12 10  CREATININE 0.76 0.73  CALCIUM 10.0 9.0  GFRNONAA >60 >60  ANIONGAP 11 10    Hematology Recent Labs  Lab 02/11/20 1500 02/12/20 0328  WBC 17.6* 13.3*  RBC 4.81 4.26  HGB 15.2* 13.2  HCT 44.8 38.8  MCV 93.1 91.1  MCH 31.6 31.0  MCHC 33.9 34.0  RDW 12.1 12.5  PLT 281 200   BNPNo results for input(s): BNP, PROBNP in the last 168 hours.  DDimer No results for input(s): DDIMER in the last 168 hours.   Radiology  DG Chest 2 View  Result Date: 02/11/2020 CLINICAL DATA:  Chest pain, arm tingling EXAM:  CHEST - 2 VIEW COMPARISON:  X-ray thoracic spine 06/10/2014 FINDINGS: The heart size and mediastinal contours are within normal limits. No focal consolidation. No pulmonary edema. No pleural effusion. No pneumothorax. No acute osseous abnormality. 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 IMPRESSION: 1. No active cardiopulmonary disease. 2. 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 Recommend correlation with tenderness to palpation to evaluate for an underlying acute component. Electronically Signed   By: Tish Frederickson M.D.   On: 02/11/2020 15:07   CARDIAC CATHETERIZATION  Result Date: 02/11/2020  Separate ostia of the LAD and circumflex.  Mid LAD lesion is 100% stenosed.  A drug-eluting stent was successfully placed using a SYNERGY XD 2.25X24, postdilated to 2.9 mm and optimized with IVUS.  Post intervention, there is a 0% residual stenosis.  Ostial/Prox LAD lesion is 75% stenosed.  A drug-eluting stent was successfully placed using a SYNERGY XD 3.50X12, postdilated to 3.9 mm and optimized with IVUS.  Post intervention, there is a 0% residual stenosis.  Mid LAD to Dist LAD diffusely calcified, diseased.  There is mild left ventricular systolic dysfunction.  LV end diastolic pressure is normal.  The left ventricular ejection fraction is 45-50% by visual estimate.  There is no aortic valve stenosis.  Continue aggressive secondary prevention.  Beta blocker and high dose statin started.  Watch in ICU.  Check echocardiogram for evidence of LV thrombus.  Due to her late presentation, I would not be surprised if she does have some LV dysfunction.  She will need medical therapy to help improve LVEF.  No other targets for PCI as her mid to distal LAD is diffusely calcified and diseased.    Cardiac Studies  [facility] 02/11/2020  Separate ostia of the LAD and circumflex.  Mid LAD lesion is 100% stenosed.  A  drug-eluting stent was successfully placed using a SYNERGY XD 2.25X24, postdilated to 2.9 mm and optimized with IVUS.  Post intervention, there is a 0% residual stenosis.  Ostial/Prox LAD lesion is 75% stenosed.  A drug-eluting stent was successfully placed using a SYNERGY XD 3.50X12, postdilated to 3.9 mm and optimized with IVUS.  Post intervention, there is a 0% residual stenosis.  Mid LAD to Dist LAD diffusely calcified, diseased.  There is mild left ventricular systolic dysfunction.  LV end diastolic pressure is normal.  The left ventricular ejection fraction is 45-50% by visual estimate.  There is no aortic valve stenosis.   Continue  aggressive secondary prevention.  Beta blocker and high dose statin started.  Watch in ICU.  Check echocardiogram for evidence of LV thrombus.  Due to her late presentation, I would not be surprised if she does have some LV dysfunction.  She will need medical therapy to help improve LVEF.  No other targets for PCI as her mid to distal LAD is diffusely calcified and diseased.   Patient Profile  Penny Nash is a 49 y.o. female with Turner syndrome who was admitted on 02/11/2020 with anterior wall STEMI.  Assessment & Plan   1. Acute anterior wall STEMI -Admitted with 36 hours of pain. Taken to the Cath Lab and found to have occluded mid LAD. Status post PCI. -Continue aspirin Brilinta. -TSH for tomorrow. Echo pending. -Continue Lipitor 80 mg a day -We will see what her ejection fraction is. May need prophylactic Coumadin if she an akinetic apex. -She will remain in ICU today. Likely transfer to floor tomorrow. Remain on telemetry. -She will need likely an ACE and/or ARB pending on echo. -Continue metoprolol tartrate 12.5 mg twice daily  2. Turner syndrome -She will need monitoring of her aorta as an outpatient. No concerns for dissection per interventional cardiology yesterday.  FEN -Intravenous fluids post-cath -Heart healthy  diet -DVT PPx: Heparin -Code: Full  For questions or updates, please contact [facility] Please consult [email or web address] for contact info under   Time Spent with Patient: I have spent a total of 35 minutes with patient reviewing hospital notes, telemetry, EKGs, labs and examining the patient as well as establishing an assessment and plan that was discussed with the patient.  > 50% of time was spent in direct patient care.    Signed, Lenna Gilford. Flora Lipps, MD [facility]   [facility]  02/12/2020 7:58 AM

## 2020-02-12 NOTE — Progress Notes (Signed)
  Echocardiogram 2D Echocardiogram has been performed.  Delcie Roch 02/12/2020, 3:03 PM

## 2020-02-12 NOTE — Discharge Instructions (Signed)

## 2020-02-13 DIAGNOSIS — I2109 ST elevation (STEMI) myocardial infarction involving other coronary artery of anterior wall: Secondary | ICD-10-CM | POA: Diagnosis not present

## 2020-02-13 LAB — BASIC METABOLIC PANEL
Anion gap: 10 (ref 5–15)
BUN: 14 mg/dL (ref 6–20)
CO2: 24 mmol/L (ref 22–32)
Calcium: 8.9 mg/dL (ref 8.9–10.3)
Chloride: 102 mmol/L (ref 98–111)
Creatinine, Ser: 0.82 mg/dL (ref 0.44–1.00)
GFR, Estimated: >60 mL/min (ref >60)
Glucose, Bld: 118 mg/dL — ABNORMAL HIGH (ref 70–99)
Potassium: 4 mmol/L (ref 3.5–5.1)
Sodium: 136 mmol/L (ref 135–145)

## 2020-02-13 LAB — CBC
HCT: 37.1 % (ref 36.0–46.0)
Hemoglobin: 12.8 g/dL (ref 12.0–15.0)
MCH: 31.9 pg (ref 26.0–34.0)
MCHC: 34.5 g/dL (ref 30.0–36.0)
MCV: 92.5 fL (ref 80.0–100.0)
Platelets: 209 10*3/uL (ref 150–400)
RBC: 4.01 MIL/uL (ref 3.87–5.11)
RDW: 12.6 % (ref 11.5–15.5)
WBC: 11.8 10*3/uL — ABNORMAL HIGH (ref 4.0–10.5)
nRBC: 0 % (ref 0.0–0.2)

## 2020-02-13 LAB — TSH: TSH: 3.118 u[IU]/mL (ref 0.350–4.500)

## 2020-02-13 MED ORDER — ENOXAPARIN SODIUM 40 MG/0.4ML ~~LOC~~ SOLN
40.0000 mg | SUBCUTANEOUS | Status: DC
  Administered 2020-02-13 – 2020-02-14 (×2): 40 mg via SUBCUTANEOUS
  Filled 2020-02-13 (×2): qty 0.4

## 2020-02-13 MED ORDER — METOPROLOL SUCCINATE ER 25 MG PO TB24
12.5000 mg | ORAL_TABLET | Freq: Every day | ORAL | Status: DC
  Administered 2020-02-13 – 2020-02-14 (×2): 12.5 mg via ORAL
  Filled 2020-02-13 (×2): qty 1

## 2020-02-13 MED ORDER — LISINOPRIL 2.5 MG PO TABS
2.5000 mg | ORAL_TABLET | Freq: Every day | ORAL | Status: DC
  Administered 2020-02-13 – 2020-02-14 (×2): 2.5 mg via ORAL
  Filled 2020-02-13 (×2): qty 1

## 2020-02-13 NOTE — Care Management (Signed)
Benefit check submitted for Brilinta, will result on Monday. Patient eligible for 30 day free card, and copay reduction card.

## 2020-02-13 NOTE — Progress Notes (Signed)
Cardiology Progress Note  Patient ID: Penny Nash MRN: [id number] DOB: 09-22-1970 Date of Encounter: 02/13/2020  Primary Cardiologist: No primary care provider on file.  Subjective   Chief Complaint: Denies chest pain.  Stable overnight.  HPI: No chest pain.  Telemetry without significant arrhythmias.  Right radial cath site clean and dry.  ROS:  All other ROS reviewed and negative. Pertinent positives noted in the HPI.     Inpatient Medications  Scheduled Meds: . aspirin  81 mg Oral Daily  . atorvastatin  80 mg Oral Daily  . Chlorhexidine Gluconate Cloth  6 each Topical Daily  . metoprolol tartrate  12.5 mg Oral BID  . sodium chloride flush  3 mL Intravenous Q12H  . ticagrelor  90 mg Oral BID   Continuous Infusions: . sodium chloride     PRN Meds: sodium chloride, acetaminophen, nitroGLYCERIN, ondansetron (ZOFRAN) IV, sodium chloride flush   Vital Signs   Vitals:   02/13/20 0500 02/13/20 0645 02/13/20 0700 02/13/20 0734  BP: (!) 84/58  94/71   Pulse:      Resp: 19  19   Temp:    98.7 F (37.1 C)  TempSrc:      SpO2:   99%   Weight:  54.1 kg    Height:        Intake/Output Summary (Last 24 hours) at 02/13/2020 0750 Last data filed at 02/13/2020 0734 Gross per 24 hour  Intake 1500 ml  Output 300 ml  Net 1200 ml   Last 3 Weights 02/13/2020 02/12/2020 02/11/2020  Weight (lbs) 119 lb 4.8 oz 120 lb 13 oz 120 lb  Weight (kg) 54.114 kg 54.8 kg 54.432 kg      Telemetry  Overnight telemetry shows brief A. tach episode that lasted roughly 2 seconds overnight, normal sinus rhythm, which I personally reviewed.   ECG  The most recent ECG shows normal sinus rhythm heart rate 75, Q waves with ST elevation anterior leads concerning for LV aneurysm, which I personally reviewed.   Physical Exam   Vitals:   02/13/20 0500 02/13/20 0645 02/13/20 0700 02/13/20 0734  BP: (!) 84/58  94/71   Pulse:      Resp: 19  19   Temp:    98.7 F (37.1 C)  TempSrc:       SpO2:   99%   Weight:  54.1 kg    Height:         Intake/Output Summary (Last 24 hours) at 02/13/2020 0750 Last data filed at 02/13/2020 0734 Gross per 24 hour  Intake 1500 ml  Output 300 ml  Net 1200 ml    Last 3 Weights 02/13/2020 02/12/2020 02/11/2020  Weight (lbs) 119 lb 4.8 oz 120 lb 13 oz 120 lb  Weight (kg) 54.114 kg 54.8 kg 54.432 kg    Body mass index is 25.82 kg/m.  General: Well nourished, well developed, in no acute distress Head: Atraumatic, normal size  Eyes: PEERLA, EOMI  Neck: Supple, no JVD Endocrine: No thryomegaly Cardiac: Normal S1, S2; RRR; no murmurs, rubs, or gallops Lungs: Clear to auscultation bilaterally, no wheezing, rhonchi or rales  Abd: Soft, nontender, no hepatomegaly  Ext: No edema, pulses 2+ Musculoskeletal: No deformities, BUE and BLE strength normal and equal Skin: Warm and dry, no rashes   Neuro: Alert and oriented to person, place, time, and situation, CNII-XII grossly intact, no focal deficits  Psych: Normal mood and affect   Labs  High Sensitivity Troponin:   Recent Labs  Lab 02/11/20 1500 02/11/20 1617 02/12/20 0832 02/12/20 1117  TROPONINIHS 13,067* 15,101* 19,315* 17,940*     Cardiac EnzymesNo results for input(s): TROPONINI in the last 168 hours. No results for input(s): TROPIPOC in the last 168 hours.  Chemistry Recent Labs  Lab 02/11/20 1500 02/12/20 0328 02/12/20 0832  NA 138 138 136  K 3.9 3.8 3.6  CL 101 103 101  CO2 26 25 23   GLUCOSE 169* 117* 143*  BUN 12 10 12   CREATININE 0.76 0.73 0.77  CALCIUM 10.0 9.0 8.9  GFRNONAA >60 >60 >60  ANIONGAP 11 10 12     Hematology Recent Labs  Lab 02/11/20 1500 02/12/20 0328 02/13/20 0105  WBC 17.6* 13.3* 11.8*  RBC 4.81 4.26 4.01  HGB 15.2* 13.2 12.8  HCT 44.8 38.8 37.1  MCV 93.1 91.1 92.5  MCH 31.6 31.0 31.9  MCHC 33.9 34.0 34.5  RDW 12.1 12.5 12.6  PLT 281 200 209   BNPNo results for input(s): BNP, PROBNP in the last 168 hours.  DDimer No results for  input(s): DDIMER in the last 168 hours.   Radiology  DG Chest 2 View  Result Date: 02/11/2020 CLINICAL DATA:  Chest pain, arm tingling EXAM: CHEST - 2 VIEW COMPARISON:  X-ray thoracic spine 06/10/2014 FINDINGS: The heart size and mediastinal contours are within normal limits. No focal consolidation. No pulmonary edema. No pleural effusion. No pneumothorax. No acute osseous abnormality. 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 IMPRESSION: 1. No active cardiopulmonary disease. 2. 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 Recommend correlation with tenderness to palpation to evaluate for an underlying acute component. Electronically Signed   By: Tish FredericksonMorgane  Naveau M.D.   On: 02/11/2020 15:07   CARDIAC CATHETERIZATION  Addendum Date: 02/12/2020    Separate ostia of the LAD and circumflex. No left main.  Mid LAD lesion is 100% stenosed. This was culprit lesion.  After aspiration thrombectomy, IVUS, PTCA with cutting balloon, a drug-eluting stent was successfully placed using a SYNERGY XD 2.25X24, postdilated to 2.9 mm and optimized with IVUS.  Post intervention, there is a 0% residual stenosis.  Ostial/Prox LAD lesion is 75% stenosed.  A drug-eluting stent was successfully placed using a SYNERGY XD 3.50X12, postdilated to 3.9 mm and optimized with IVUS.  Post intervention, there is a 0% residual stenosis.  Mid LAD to Dist LAD diffusely calcified, diseased- visualized by IVUS.  There is mild left ventricular systolic dysfunction, apical hypokinesis- likely related to late presentation of acute anterior MI.  LV end diastolic pressure is normal.  The left ventricular ejection fraction is 45-50% by visual estimate.  There is no aortic valve stenosis.  Continue aggressive secondary prevention.  Beta blocker and high dose statin started.  Watch in ICU.  Check echocardiogram for evidence of LV thrombus.  Due to her  late presentation, I would not be surprised if she does have some LV dysfunction.  She will need medical therapy to help improve LVEF.  No other targets for PCI as her mid to distal LAD is diffusely calcified and diseased.   Result Date: 02/11/2020  Separate ostia of the LAD and circumflex.  Mid LAD lesion is 100% stenosed.  A drug-eluting stent was successfully placed using a SYNERGY XD 2.25X24, postdilated to 2.9 mm and optimized with IVUS.  Post intervention, there is a 0% residual stenosis.  Ostial/Prox LAD lesion is 75% stenosed.  A drug-eluting stent was successfully placed using a SYNERGY XD  3.50X12, postdilated to 3.9 mm and optimized with IVUS.  Post intervention, there is a 0% residual stenosis.  Mid LAD to Dist LAD diffusely calcified, diseased.  There is mild left ventricular systolic dysfunction.  LV end diastolic pressure is normal.  The left ventricular ejection fraction is 45-50% by visual estimate.  There is no aortic valve stenosis.  Continue aggressive secondary prevention.  Beta blocker and high dose statin started.  Watch in ICU.  Check echocardiogram for evidence of LV thrombus.  Due to her late presentation, I would not be surprised if she does have some LV dysfunction.  She will need medical therapy to help improve LVEF.  No other targets for PCI as her mid to distal LAD is diffusely calcified and diseased.   ECHOCARDIOGRAM COMPLETE  Result Date: 02/12/2020    ECHOCARDIOGRAM REPORT   Patient Name:   Penny Nash Date of Exam: 02/12/2020 Medical Rec #:  [id number]          Height:       57.0 in Accession #:    [id number]         Weight:       120.8 lb Date of Birth:  1970-03-05          BSA:          1.452 m Patient Age:    49 years           BP:           94/75 mmHg Patient Gender: F                  HR:           77 bpm. Exam Location:  Inpatient Procedure: 2D Echo Indications:    acute myocardial infarction  History:        Patient has no prior history of  Echocardiogram examinations.  Sonographer:    Delcie Roch Referring Phys: [id number] JAYADEEP S VARANASI IMPRESSIONS  1. There is severe hypokinesis of the mid anterior wall/septum into the apex and around the apical septum consistent with LAD infarction. Left ventricular ejection fraction, by estimation, is 40 to 45%. Left ventricular ejection fraction by 2D MOD biplane is 44.3 %. The left ventricle has mildly decreased function. The left ventricle demonstrates regional wall motion abnormalities (see scoring diagram/findings for description). Left ventricular diastolic parameters were normal.  2. Right ventricular systolic function is normal. The right ventricular size is normal. Tricuspid regurgitation signal is inadequate for assessing PA pressure.  3. The mitral valve is grossly normal. Trivial mitral valve regurgitation. No evidence of mitral stenosis.  4. The aortic valve is tricuspid. Aortic valve regurgitation is not visualized. No aortic stenosis is present.  5. The inferior vena cava is normal in size with greater than 50% respiratory variability, suggesting right atrial pressure of 3 mmHg. Conclusion(s)/Recommendation(s): Findings consistent with ischemic cardiomyopathy. FINDINGS  Left Ventricle: There is severe hypokinesis of the mid anterior wall/septum into the apex and around the apical septum consistent with LAD infarction. Left ventricular ejection fraction, by estimation, is 40 to 45%. Left ventricular ejection fraction by  2D MOD biplane is 44.3 %. The left ventricle has mildly decreased function. The left ventricle demonstrates regional wall motion abnormalities. The left ventricular internal cavity size was normal in size. There is no left ventricular hypertrophy. Left ventricular diastolic parameters were normal.  LV Wall Scoring: The mid inferoseptal segment, apical septal segment, apical anterior segment, apical inferior segment, and apex are hypokinetic. Right  Ventricle: The right  ventricular size is normal. No increase in right ventricular wall thickness. Right ventricular systolic function is normal. Tricuspid regurgitation signal is inadequate for assessing PA pressure. Left Atrium: Left atrial size was normal in size. Right Atrium: Right atrial size was normal in size. Pericardium: Trivial pericardial effusion is present. Presence of pericardial fat pad. Mitral Valve: The mitral valve is grossly normal. Trivial mitral valve regurgitation. No evidence of mitral valve stenosis. Tricuspid Valve: The tricuspid valve is grossly normal. Tricuspid valve regurgitation is not demonstrated. No evidence of tricuspid stenosis. Aortic Valve: The aortic valve is tricuspid. Aortic valve regurgitation is not visualized. No aortic stenosis is present. Pulmonic Valve: The pulmonic valve was grossly normal. Pulmonic valve regurgitation is not visualized. No evidence of pulmonic stenosis. Aorta: The aortic root and ascending aorta are structurally normal, with no evidence of dilitation. Venous: The inferior vena cava is normal in size with greater than 50% respiratory variability, suggesting right atrial pressure of 3 mmHg. IAS/Shunts: The atrial septum is grossly normal.  LEFT VENTRICLE PLAX 2D                        Biplane EF (MOD) LVIDd:         4.20 cm         LV Biplane EF:   Left LVIDs:         2.70 cm                          ventricular LV PW:         1.00 cm                          ejection LV IVS:        0.80 cm                          fraction by LVOT diam:     1.70 cm                          2D MOD LV SV:         44                               biplane is LV SV Index:   30                               44.3 %. LVOT Area:     2.27 cm                                Diastology                                LV e' medial:    7.94 cm/s LV Volumes (MOD)               LV E/e' medial:  11.8 LV vol d, MOD    55.1 ml       LV e' lateral:   9.57 cm/s A2C:  LV E/e' lateral: 9.8  LV vol d, MOD    61.4 ml A4C: LV vol s, MOD    32.5 ml A2C: LV vol s, MOD    32.4 ml A4C: LV SV MOD A2C:   22.6 ml LV SV MOD A4C:   61.4 ml LV SV MOD BP:    26.0 ml RIGHT VENTRICLE             IVC RV S prime:     17.70 cm/s  IVC diam: 1.80 cm TAPSE (M-mode): 1.9 cm LEFT ATRIUM             Index       RIGHT ATRIUM          Index LA diam:        3.30 cm 2.27 cm/m  RA Area:     7.47 cm LA Vol (A2C):   30.7 ml 21.15 ml/m RA Volume:   12.50 ml 8.61 ml/m LA Vol (A4C):   32.6 ml 22.45 ml/m LA Biplane Vol: 33.3 ml 22.94 ml/m  AORTIC VALVE LVOT Vmax:   96.00 cm/s LVOT Vmean:  66.100 cm/s LVOT VTI:    0.194 m  AORTA Ao Root diam: 2.70 cm Ao Asc diam:  2.80 cm MITRAL VALVE MV Area (PHT): 3.48 cm     SHUNTS MV Decel Time: 218 msec     Systemic VTI:  0.19 m MV E velocity: 93.60 cm/s   Systemic Diam: 1.70 cm MV A velocity: 104.00 cm/s MV E/A ratio:  0.90 Lennie Odor MD Electronically signed by Lennie Odor MD Signature Date/Time: 02/12/2020/3:40:13 PM    Final     Cardiac Studies  TTE 02/11/2020 1. There is severe hypokinesis of the mid anterior wall/septum into the  apex and around the apical septum consistent with LAD infarction. Left  ventricular ejection fraction, by estimation, is 40 to 45%. Left  ventricular ejection fraction by 2D MOD  biplane is 44.3 %. The left ventricle has mildly decreased function. The  left ventricle demonstrates regional wall motion abnormalities (see  scoring diagram/findings for description). Left ventricular diastolic  parameters were normal.  2. Right ventricular systolic function is normal. The right ventricular  size is normal. Tricuspid regurgitation signal is inadequate for assessing  PA pressure.  3. The mitral valve is grossly normal. Trivial mitral valve  regurgitation. No evidence of mitral stenosis.  4. The aortic valve is tricuspid. Aortic valve regurgitation is not  visualized. No aortic stenosis is present.  5. The inferior vena cava is normal in  size with greater than 50%  respiratory variability, suggesting right atrial pressure of 3 mmHg.   LHC 02/11/2020   Separate ostia of the LAD and circumflex. No left main.  Mid LAD lesion is 100% stenosed. This was culprit lesion.  After aspiration thrombectomy, IVUS, PTCA with cutting balloon, a drug-eluting stent was successfully placed using a SYNERGY XD 2.25X24, postdilated to 2.9 mm and optimized with IVUS.  Post intervention, there is a 0% residual stenosis.  Ostial/Prox LAD lesion is 75% stenosed.  A drug-eluting stent was successfully placed using a SYNERGY XD 3.50X12, postdilated to 3.9 mm and optimized with IVUS.  Post intervention, there is a 0% residual stenosis.  Mid LAD to Dist LAD diffusely calcified, diseased- visualized by IVUS.  There is mild left ventricular systolic dysfunction, apical hypokinesis- likely related to late presentation of acute anterior MI.  LV end diastolic pressure is normal.  The left ventricular ejection fraction is 45-50% by visual estimate.  There is no aortic valve stenosis.   Continue aggressive secondary prevention.  Beta blocker and high dose statin started.  Watch in ICU.  Check echocardiogram for evidence of LV thrombus.  Due to her late presentation, I would not be surprised if she does have some LV dysfunction.  She will need medical therapy to help improve LVEF.  No other targets for PCI as her mid to distal LAD is diffusely calcified and diseased.   Patient Profile  Penny Nash is a 49 y.o. female with history of Turner syndrome who was admitted on 02/11/2020 with anterior wall STEMI.  Assessment & Plan   1.  Anterior wall STEMI -Status post PCI to the mid LAD.  Admitted with 36 hours of pain.  Good result.  Continue aspirin Brilinta. -Echo with EF around 40% with severe hypokinesis of the mid to distal apical septum and anterior wall into the apex -We will switch her over to metoprolol succinate.  She has very low  blood pressure at baseline.  We will try to get on lisinopril 2.5 mg daily given anterior wall MI.  -Continue high intensity statin -Euvolemic on examination -Transfer to floor today  2.  Turner syndrome -She will need outpatient imaging of her entire aorta.  No concerns for dissection.  FEN -No intravenous fluids -Heart healthy diet -DVT PPx: Lovenox -Code: Full -Disposition: Anticipate discharge in the next 1 to 2 days given anterior wall MI  For questions or updates, please contact [facility] Please consult [email or web address] for contact info under   Time Spent with Patient: I have spent a total of 35 minutes with patient reviewing hospital notes, telemetry, EKGs, labs and examining the patient as well as establishing an assessment and plan that was discussed with the patient.  > 50% of time was spent in direct patient care.    Signed, Lenna Gilford. Flora Lipps, MD [facility]  [facility]  02/13/2020 7:50 AM

## 2020-02-13 NOTE — Progress Notes (Signed)
Pt arrived from unit from 2 heart . VSS.Will continue to monitor. Pt. Oriented to unit   Friend Dorfman K Acadia Thammavong, RN  

## 2020-02-13 NOTE — Progress Notes (Signed)
Pt was transported to[facility]. Pt had cellphone, charger, clothing, purse, and stent card with her. Penny Nash

## 2020-02-14 ENCOUNTER — Encounter (HOSPITAL_COMMUNITY): Payer: Self-pay | Admitting: Interventional Cardiology

## 2020-02-14 DIAGNOSIS — Q969 Turner's syndrome, unspecified: Secondary | ICD-10-CM

## 2020-02-14 DIAGNOSIS — I255 Ischemic cardiomyopathy: Secondary | ICD-10-CM

## 2020-02-14 DIAGNOSIS — I2109 ST elevation (STEMI) myocardial infarction involving other coronary artery of anterior wall: Secondary | ICD-10-CM | POA: Diagnosis not present

## 2020-02-14 DIAGNOSIS — E785 Hyperlipidemia, unspecified: Secondary | ICD-10-CM

## 2020-02-14 DIAGNOSIS — I5022 Chronic systolic (congestive) heart failure: Secondary | ICD-10-CM

## 2020-02-14 LAB — BASIC METABOLIC PANEL
Anion gap: 11 (ref 5–15)
BUN: 16 mg/dL (ref 6–20)
CO2: 22 mmol/L (ref 22–32)
Calcium: 9.1 mg/dL (ref 8.9–10.3)
Chloride: 104 mmol/L (ref 98–111)
Creatinine, Ser: 0.82 mg/dL (ref 0.44–1.00)
GFR, Estimated: >60 mL/min (ref >60)
Glucose, Bld: 91 mg/dL (ref 70–99)
Potassium: 3.6 mmol/L (ref 3.5–5.1)
Sodium: 137 mmol/L (ref 135–145)

## 2020-02-14 LAB — CBC
HCT: 38.9 % (ref 36.0–46.0)
Hemoglobin: 12.9 g/dL (ref 12.0–15.0)
MCH: 31.2 pg (ref 26.0–34.0)
MCHC: 33.2 g/dL (ref 30.0–36.0)
MCV: 94 fL (ref 80.0–100.0)
Platelets: 175 10*3/uL (ref 150–400)
RBC: 4.14 MIL/uL (ref 3.87–5.11)
RDW: 12.2 % (ref 11.5–15.5)
WBC: 13.4 10*3/uL — ABNORMAL HIGH (ref 4.0–10.5)
nRBC: 0 % (ref 0.0–0.2)

## 2020-02-14 LAB — POCT ACTIVATED CLOTTING TIME
Activated Clotting Time: 321 seconds
Activated Clotting Time: 434 seconds

## 2020-02-14 MED ORDER — ATORVASTATIN CALCIUM 80 MG PO TABS: 80.0000 mg | ORAL_TABLET | Freq: Every day | ORAL | 3 refills | Status: DC

## 2020-02-14 MED ORDER — POTASSIUM CHLORIDE CRYS ER 20 MEQ PO TBCR
20.0000 meq | EXTENDED_RELEASE_TABLET | Freq: Once | ORAL | Status: AC
  Administered 2020-02-14: 12:00:00 20 meq via ORAL
  Filled 2020-02-14: qty 1

## 2020-02-14 MED ORDER — LISINOPRIL 2.5 MG PO TABS: 2.5000 mg | ORAL_TABLET | Freq: Every day | ORAL | 3 refills | Status: DC

## 2020-02-14 MED ORDER — METOPROLOL SUCCINATE ER 25 MG PO TB24: 12.5000 mg | ORAL_TABLET | Freq: Every day | ORAL | 3 refills | Status: DC

## 2020-02-14 MED ORDER — TICAGRELOR 90 MG PO TABS: 90.0000 mg | ORAL_TABLET | Freq: Two times a day (BID) | ORAL | 3 refills | Status: DC

## 2020-02-14 MED ORDER — NITROGLYCERIN 0.4 MG SL SUBL: 0.4000 mg | SUBLINGUAL_TABLET | SUBLINGUAL | 3 refills | Status: AC | PRN

## 2020-02-14 MED ORDER — FUROSEMIDE 20 MG PO TABS: 20.0000 mg | ORAL_TABLET | Freq: Every day | ORAL | 2 refills | Status: DC | PRN

## 2020-02-14 MED ORDER — ASPIRIN EC 81 MG PO TBEC: 81.0000 mg | DELAYED_RELEASE_TABLET | Freq: Every day | ORAL | 2 refills | Status: AC

## 2020-02-14 MED FILL — Tirofiban HCl in NaCl 0.9% IV Soln 5 MG/100ML (Base Equiv): INTRAVENOUS | Qty: 100 | Status: AC

## 2020-02-14 MED FILL — NITROGLYCERIN 0.4 MG TAB SL: 0.4 | 7 days supply | Qty: 25 | Fill #0

## 2020-02-14 MED FILL — LISINOPRIL 2.5 MG TABLET: 2.5 | 90 days supply | Qty: 90 | Fill #0

## 2020-02-14 MED FILL — ATORVASTATIN CALCIUM 80 MG: 80 | 90 days supply | Qty: 90 | Fill #0

## 2020-02-14 MED FILL — METOPROLOL SUCCINATE ER 25: 25 | 90 days supply | Qty: 45 | Fill #0

## 2020-02-14 MED FILL — ASPIRIN LOW DOSE 81 MG TBEC: 81 | 90 days supply | Qty: 90 | Fill #0

## 2020-02-14 MED FILL — BRILINTA 90 MG TABLET: 90 | 90 days supply | Qty: 180 | Fill #0

## 2020-02-14 MED FILL — FUROSEMIDE 20 MG TAB: 20 | 30 days supply | Qty: 30 | Fill #0

## 2020-02-14 NOTE — TOC Benefit Eligibility Note (Signed)
Transition of Care [facility]) Benefit Eligibility Note    Patient Details  Name: Penny Nash MRN: [id number] Date of Birth: 02-03-1971   Medication/Dose: Marden Noble 90mg . BID for 30 day supply  Covered?: Yes  Tier: 2 Drug  Prescription Coverage [facility]  Spoke with Person/Company/Phone Number:: [phone]. [facility] Ph# [phone]  Co-Pay: $30.00  Prior Approval: No  Deductible: Unmet       [phone] Phone Number: 02/14/2020, 1:58 PM

## 2020-02-14 NOTE — Progress Notes (Signed)
CARDIAC REHAB PHASE I   PRE:  Rate/Rhythm: 76 SR  BP:  Sitting: 95/72      SaO2: 97 RA  MODE:  Ambulation: 470 ft   POST:  Rate/Rhythm: 100 ST  BP:  Sitting: 101/72    SaO2: 99 RA  Pt ambulated 458ft in hallway independently with quick, steady gait. Pt denies CP, SOB, dizziness, or palpitations. Pt educated on importance of ASA, Brilinta, statin, and NTG. Pt given MI book along with heart healthy diet. Reviewed restrictions, site care, and exercise guidelines. Encouraged daily weights. Will refer to CRP II [place].  [id number] Reynold Bowen, RN BSN 02/14/2020 10:59 AM

## 2020-02-14 NOTE — Discharge Summary (Addendum)
Discharge Summary    Patient ID: Penny Nash MRN: [id number]; DOB: Feb 10, 1971  Admit date: 02/11/2020 Discharge date: 02/14/2020  Primary Care Provider: Macy Mis, MD  Primary Cardiologist: Lance Muss, MD  Primary Electrophysiologist:  None   Discharge Diagnoses    Principal Problem:   Acute anterior wall MI [facility]) Active Problems:   Hyperlipidemia with target LDL less than 70   Turner syndrome   Ischemic cardiomyopathy   Chronic systolic heart failure (HCC)    Diagnostic Studies/Procedures    Echo 02/12/20: 1. There is severe hypokinesis of the mid anterior wall/septum into the  apex and around the apical septum consistent with LAD infarction. Left  ventricular ejection fraction, by estimation, is 40 to 45%. Left  ventricular ejection fraction by 2D MOD  biplane is 44.3 %. The left ventricle has mildly decreased function. The  left ventricle demonstrates regional wall motion abnormalities (see  scoring diagram/findings for description). Left ventricular diastolic  parameters were normal.  2. Right ventricular systolic function is normal. The right ventricular  size is normal. Tricuspid regurgitation signal is inadequate for assessing  PA pressure.  3. The mitral valve is grossly normal. Trivial mitral valve  regurgitation. No evidence of mitral stenosis.  4. The aortic valve is tricuspid. Aortic valve regurgitation is not  visualized. No aortic stenosis is present.  5. The inferior vena cava is normal in size with greater than 50%  respiratory variability, suggesting right atrial pressure of 3 mmHg.   Left heart cath 02/11/20:  Separate ostia of the LAD and circumflex. No left main.  Mid LAD lesion is 100% stenosed. This was culprit lesion.  After aspiration thrombectomy, IVUS, PTCA with cutting balloon, a drug-eluting stent was successfully placed using a SYNERGY XD 2.25X24, postdilated to 2.9 mm and optimized with IVUS.  Post  intervention, there is a 0% residual stenosis.  Ostial/Prox LAD lesion is 75% stenosed.  A drug-eluting stent was successfully placed using a SYNERGY XD 3.50X12, postdilated to 3.9 mm and optimized with IVUS.  Post intervention, there is a 0% residual stenosis.  Mid LAD to Dist LAD diffusely calcified, diseased- visualized by IVUS.  There is mild left ventricular systolic dysfunction, apical hypokinesis- likely related to late presentation of acute anterior MI.  LV end diastolic pressure is normal.  The left ventricular ejection fraction is 45-50% by visual estimate.  There is no aortic valve stenosis.  Continue aggressive secondary prevention. Beta blocker and high dose statin started. Watch in ICU. Check echocardiogram for evidence of LV thrombus. Due to her late presentation, I would not be surprised if she does have some LV dysfunction. She will need medical therapy to help improve LVEF. No other targets for PCI as her mid to distal LAD is diffusely calcified and diseased. _____________   History of Present Illness     Penny Nash is a 49 y.o. female with a history of Turner syndrome who presented with anterior wall STEMI.   Penny Nash began having chest pain at 9 PM last night.  It has been fairly constant but better now than it was earlier.  She came to the emergency room.  Initial ECG did not concern them for STEMI.  Her troponin came back and repeat ECG showed worsening ST segments.  Code STEMI was called.  Her pain actually improved over time.  She has been given some sublingual nitroglycerin.  She has not had any family history of coronary artery disease.  Overall, she is quite healthy.  Hospital Course     Consultants: none  Anterior STEMI Patient presented with chest pain and EKG with anterior ST elevation. Left heart cath with 100% stenosis in the mid LAD treated with thrombectomy, atherectomy, and DES; 75% proximal LAD stenosis treated with DES. She  tolerated the procedure well and was started on ASA, brilinta, BB, and 80 mg lipitor.    Ischemic cardiomyopathy Chronic systolic heart failure She admits to 3 lb weight gain prior to admission. EF 40-45%. GDMT has been limited by soft/marginal pressure. Stated on low dose BB and lisinopril. Will discharge with 20 mg lasix to use PRN for weight gain.  Plan for repeat echo in 3 months.    Turner syndrome Plan for OP imaging of entire aorta. No concerns for dissection today.    Hyperlipidemia with LDL goal < 70 02/12/2020: Cholesterol 162; HDL 55; LDL Cholesterol 94; Triglycerides 65; VLDL 13 Continue 80 mg lipitor.     Did the patient have an acute coronary syndrome (MI, NSTEMI, STEMI, etc) this admission?:  Yes                               AHA/ACC Clinical Performance & Quality Measures: 1. Aspirin prescribed? - Yes 2. ADP Receptor Inhibitor (Plavix/Clopidogrel, Brilinta/Ticagrelor or Effient/Prasugrel) prescribed (includes medically managed patients)? - Yes 3. Beta Blocker prescribed? - Yes 4. High Intensity Statin (Lipitor 40-80mg  or Crestor 20-40mg ) prescribed? - Yes 5. EF assessed during THIS hospitalization? - Yes 6. For EF <40%, was ACEI/ARB prescribed? - Yes 7. For EF <40%, Aldosterone Antagonist (Spironolactone or Eplerenone) prescribed? - No - Reason:  marginal pressure 8. Cardiac Rehab Phase II ordered (including medically managed patients)? - Yes     _____________  Discharge Vitals Blood pressure 98/69, pulse 72, temperature 98 F (36.7 C), temperature source Oral, resp. rate 17, height 4\' 9"  (1.448 m), weight 54.1 kg, SpO2 98 %.  Filed Weights   02/11/20 1611 02/12/20 0600 02/13/20 0645  Weight: 54.4 kg 54.8 kg 54.1 kg    Labs & Radiologic Studies    CBC Recent Labs    02/13/20 0105 02/14/20 0048  WBC 11.8* 13.4*  HGB 12.8 12.9  HCT 37.1 38.9  MCV 92.5 94.0  PLT 209 175   Basic Metabolic Panel Recent Labs    02/16/20 0727 02/14/20 0048  NA 136  137  K 4.0 3.6  CL 102 104  CO2 24 22  GLUCOSE 118* 91  BUN 14 16  CREATININE 0.82 0.82  CALCIUM 8.9 9.1   Liver Function Tests No results for input(s): AST, ALT, ALKPHOS, BILITOT, PROT, ALBUMIN in the last 72 hours. No results for input(s): LIPASE, AMYLASE in the last 72 hours. High Sensitivity Troponin:   Recent Labs  Lab 02/11/20 1500 02/11/20 1617 02/12/20 0832 02/12/20 1117  TROPONINIHS 13,067* 15,101* 19,315* 17,940*    BNP Invalid input(s): POCBNP D-Dimer No results for input(s): DDIMER in the last 72 hours. Hemoglobin A1C Recent Labs    02/12/20 0328  HGBA1C 5.3   Fasting Lipid Panel Recent Labs    02/12/20 0328  CHOL 162  HDL 55  LDLCALC 94  TRIG 65  CHOLHDL 2.9   Thyroid Function Tests Recent Labs    02/13/20 0105  TSH 3.118   _____________  DG Chest 2 View  Result Date: 02/11/2020 CLINICAL DATA:  Chest pain, arm tingling EXAM: CHEST - 2 VIEW COMPARISON:  X-ray thoracic spine 06/10/2014 FINDINGS: The heart size  and mediastinal contours are within normal limits. No focal consolidation. No pulmonary edema. No pleural effusion. No pneumothorax. No acute osseous abnormality. 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 IMPRESSION: 1. No active cardiopulmonary disease. 2. 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 Recommend correlation with tenderness to palpation to evaluate for an underlying acute component. Electronically Signed   By: Tish Frederickson M.D.   On: 02/11/2020 15:07   CARDIAC CATHETERIZATION  Addendum Date: 02/12/2020    Separate ostia of the LAD and circumflex. No left main.  Mid LAD lesion is 100% stenosed. This was culprit lesion.  After aspiration thrombectomy, IVUS, PTCA with cutting balloon, a drug-eluting stent was successfully placed using a SYNERGY XD 2.25X24, postdilated to 2.9 mm and optimized with IVUS.  Post intervention, there is a 0%  residual stenosis.  Ostial/Prox LAD lesion is 75% stenosed.  A drug-eluting stent was successfully placed using a SYNERGY XD 3.50X12, postdilated to 3.9 mm and optimized with IVUS.  Post intervention, there is a 0% residual stenosis.  Mid LAD to Dist LAD diffusely calcified, diseased- visualized by IVUS.  There is mild left ventricular systolic dysfunction, apical hypokinesis- likely related to late presentation of acute anterior MI.  LV end diastolic pressure is normal.  The left ventricular ejection fraction is 45-50% by visual estimate.  There is no aortic valve stenosis.  Continue aggressive secondary prevention.  Beta blocker and high dose statin started.  Watch in ICU.  Check echocardiogram for evidence of LV thrombus.  Due to her late presentation, I would not be surprised if she does have some LV dysfunction.  She will need medical therapy to help improve LVEF.  No other targets for PCI as her mid to distal LAD is diffusely calcified and diseased.   Result Date: 02/11/2020  Separate ostia of the LAD and circumflex.  Mid LAD lesion is 100% stenosed.  A drug-eluting stent was successfully placed using a SYNERGY XD 2.25X24, postdilated to 2.9 mm and optimized with IVUS.  Post intervention, there is a 0% residual stenosis.  Ostial/Prox LAD lesion is 75% stenosed.  A drug-eluting stent was successfully placed using a SYNERGY XD 3.50X12, postdilated to 3.9 mm and optimized with IVUS.  Post intervention, there is a 0% residual stenosis.  Mid LAD to Dist LAD diffusely calcified, diseased.  There is mild left ventricular systolic dysfunction.  LV end diastolic pressure is normal.  The left ventricular ejection fraction is 45-50% by visual estimate.  There is no aortic valve stenosis.  Continue aggressive secondary prevention.  Beta blocker and high dose statin started.  Watch in ICU.  Check echocardiogram for evidence of LV thrombus.  Due to her late presentation, I would not be surprised if  she does have some LV dysfunction.  She will need medical therapy to help improve LVEF.  No other targets for PCI as her mid to distal LAD is diffusely calcified and diseased.   ECHOCARDIOGRAM COMPLETE  Result Date: 02/12/2020    ECHOCARDIOGRAM REPORT   Patient Name:   Penny Nash Date of Exam: 02/12/2020 Medical Rec #:  [id number]          Height:       57.0 in Accession #:    [id number]         Weight:       120.8 lb Date of Birth:  [birth year]          BSA:  1.452 m Patient Age:    49 years           BP:           94/75 mmHg Patient Gender: F                  HR:           77 bpm. Exam Location:  Inpatient Procedure: 2D Echo Indications:    acute myocardial infarction  History:        Patient has no prior history of Echocardiogram examinations.  Sonographer:    Delcie RochLauren Pennington Referring Phys: [id number] JAYADEEP S VARANASI IMPRESSIONS  1. There is severe hypokinesis of the mid anterior wall/septum into the apex and around the apical septum consistent with LAD infarction. Left ventricular ejection fraction, by estimation, is 40 to 45%. Left ventricular ejection fraction by 2D MOD biplane is 44.3 %. The left ventricle has mildly decreased function. The left ventricle demonstrates regional wall motion abnormalities (see scoring diagram/findings for description). Left ventricular diastolic parameters were normal.  2. Right ventricular systolic function is normal. The right ventricular size is normal. Tricuspid regurgitation signal is inadequate for assessing PA pressure.  3. The mitral valve is grossly normal. Trivial mitral valve regurgitation. No evidence of mitral stenosis.  4. The aortic valve is tricuspid. Aortic valve regurgitation is not visualized. No aortic stenosis is present.  5. The inferior vena cava is normal in size with greater than 50% respiratory variability, suggesting right atrial pressure of 3 mmHg. Conclusion(s)/Recommendation(s): Findings consistent with ischemic cardiomyopathy.  FINDINGS  Left Ventricle: There is severe hypokinesis of the mid anterior wall/septum into the apex and around the apical septum consistent with LAD infarction. Left ventricular ejection fraction, by estimation, is 40 to 45%. Left ventricular ejection fraction by  2D MOD biplane is 44.3 %. The left ventricle has mildly decreased function. The left ventricle demonstrates regional wall motion abnormalities. The left ventricular internal cavity size was normal in size. There is no left ventricular hypertrophy. Left ventricular diastolic parameters were normal.  LV Wall Scoring: The mid inferoseptal segment, apical septal segment, apical anterior segment, apical inferior segment, and apex are hypokinetic. Right Ventricle: The right ventricular size is normal. No increase in right ventricular wall thickness. Right ventricular systolic function is normal. Tricuspid regurgitation signal is inadequate for assessing PA pressure. Left Atrium: Left atrial size was normal in size. Right Atrium: Right atrial size was normal in size. Pericardium: Trivial pericardial effusion is present. Presence of pericardial fat pad. Mitral Valve: The mitral valve is grossly normal. Trivial mitral valve regurgitation. No evidence of mitral valve stenosis. Tricuspid Valve: The tricuspid valve is grossly normal. Tricuspid valve regurgitation is not demonstrated. No evidence of tricuspid stenosis. Aortic Valve: The aortic valve is tricuspid. Aortic valve regurgitation is not visualized. No aortic stenosis is present. Pulmonic Valve: The pulmonic valve was grossly normal. Pulmonic valve regurgitation is not visualized. No evidence of pulmonic stenosis. Aorta: The aortic root and ascending aorta are structurally normal, with no evidence of dilitation. Venous: The inferior vena cava is normal in size with greater than 50% respiratory variability, suggesting right atrial pressure of 3 mmHg. IAS/Shunts: The atrial septum is grossly normal.  LEFT  VENTRICLE PLAX 2D                        Biplane EF (MOD) LVIDd:         4.20 cm  LV Biplane EF:   Left LVIDs:         2.70 cm                          ventricular LV PW:         1.00 cm                          ejection LV IVS:        0.80 cm                          fraction by LVOT diam:     1.70 cm                          2D MOD LV SV:         44                               biplane is LV SV Index:   30                               44.3 %. LVOT Area:     2.27 cm                                Diastology                                LV e' medial:    7.94 cm/s LV Volumes (MOD)               LV E/e' medial:  11.8 LV vol d, MOD    55.1 ml       LV e' lateral:   9.57 cm/s A2C:                           LV E/e' lateral: 9.8 LV vol d, MOD    61.4 ml A4C: LV vol s, MOD    32.5 ml A2C: LV vol s, MOD    32.4 ml A4C: LV SV MOD A2C:   22.6 ml LV SV MOD A4C:   61.4 ml LV SV MOD BP:    26.0 ml RIGHT VENTRICLE             IVC RV S prime:     17.70 cm/s  IVC diam: 1.80 cm TAPSE (M-mode): 1.9 cm LEFT ATRIUM             Index       RIGHT ATRIUM          Index LA diam:        3.30 cm 2.27 cm/m  RA Area:     7.47 cm LA Vol (A2C):   30.7 ml 21.15 ml/m RA Volume:   12.50 ml 8.61 ml/m LA Vol (A4C):   32.6 ml 22.45 ml/m LA Biplane Vol: 33.3 ml 22.94 ml/m  AORTIC VALVE LVOT Vmax:   96.00 cm/s LVOT Vmean:  66.100 cm/s LVOT VTI:    0.194 m  AORTA Ao Root diam: 2.70 cm Ao Asc diam:  2.80 cm MITRAL  VALVE MV Area (PHT): 3.48 cm     SHUNTS MV Decel Time: 218 msec     Systemic VTI:  0.19 m MV E velocity: 93.60 cm/s   Systemic Diam: 1.70 cm MV A velocity: 104.00 cm/s MV E/A ratio:  0.90 Lennie Odor MD Electronically signed by Lennie Odor MD Signature Date/Time: 02/12/2020/3:40:13 PM    Final    Disposition   Pt is being discharged home today in good condition.  Follow-up Plans & Appointments     Follow-up Information    Manson Passey, [place] Follow up on 03/01/2020.   Specialty: Cardiology Why: 3:15 pm for  [facility] Contact information: [address] [place] [address] [phone]              Discharge Instructions    Amb Referral to Cardiac Rehabilitation   Complete by: As directed    Diagnosis:  Coronary Stents STEMI     After initial evaluation and assessments completed: Virtual Based Care may be provided alone or in conjunction with Phase 2 Cardiac Rehab based on patient barriers.: Yes      Discharge Medications   Allergies as of 02/14/2020   No Known Allergies     Medication List    TAKE these medications   aspirin EC 81 MG tablet Take 1 tablet (81 mg total) by mouth daily. Swallow whole.   atorvastatin 80 MG tablet Commonly known as: LIPITOR Take 1 tablet (80 mg total) by mouth daily. Start taking on: February 15, 2020   CHEWABLE CALCIUM PO Take 2 tablets by mouth in the morning and at bedtime.   furosemide 20 MG tablet Commonly known as: Lasix Take 1 tablet (20 mg total) by mouth daily as needed for fluid (3lb weight gain).   lisinopril 2.5 MG tablet Commonly known as: ZESTRIL Take 1 tablet (2.5 mg total) by mouth daily. Start taking on: February 15, 2020   metoprolol succinate 25 MG 24 hr tablet Commonly known as: TOPROL-XL Take 0.5 tablets (12.5 mg total) by mouth daily. Start taking on: February 15, 2020   nitroGLYCERIN 0.4 MG SL tablet Commonly known as: NITROSTAT Place 1 tablet (0.4 mg total) under the tongue every 5 (five) minutes as needed for chest pain.   ticagrelor 90 MG Tabs tablet Commonly known as: BRILINTA Take 1 tablet (90 mg total) by mouth 2 (two) times daily.          Outstanding Labs/Studies   Echo in 3 months  Duration of Discharge Encounter   Greater than 30 minutes including physician time.  Signed, Roe Rutherford Duke, PA 02/14/2020, 11:24 AM    Patient seen and examined. Agree with assessment and plan.  See progress note from today.  Patient is stable for discharge on aspirin/Brilinta, high potency  statin therapy, beta-blocker therapy.  Consider outpatient addition of low-dose ARB with potential transition to [facility] if LV function remains reduced.   Lennette Bihari, MD, [facility] 02/14/2020 12:31 PM

## 2020-02-14 NOTE — TOC Transition Note (Signed)
Transition of Care (TOC) - CM/SW Discharge Note Donn Pierini RN, BSN Transitions of Care Unit 4E- RN Case Manager See Treatment Team for direct phone #    Patient Details  Name: Penny Nash MRN: [id number] Date of Birth: November 26, 1970  Transition of Care [facility] CM/SW Contact:  Darrold Span, RN Phone Number: 02/14/2020, 1:21 PM   Clinical Narrative:    Pt stable for transition home today, referral for new Brilinta- per weekend staff- benefits check submitted for copay cost. This writer spoke with pt at bedside- TOC has filled and delivered meds to bedside for discharge including 30 day free Brilinta. Pt. Provided copay assist card for Brilinta for future use if needed.  No further TOC needs noted- pt states her ride will be here around 2pm for transport home.    Final next level of care: Home/Self Care Barriers to Discharge: No Barriers Identified   Patient Goals and CMS Choice Patient states their goals for this hospitalization and ongoing recovery are:: return home   Choice offered to / list presented to : NA  Discharge Placement               Home        Discharge Plan and Services   Discharge Planning Services: CM Consult,Medication Assistance Post Acute Care Choice: NA          DME Arranged: N/A DME Agency: NA       HH Arranged: NA HH Agency: NA        Social Determinants of Health (SDOH) Interventions     Readmission Risk Interventions Readmission Risk Prevention Plan 02/14/2020  Post Dischage Appt Complete  Medication Screening Complete  Transportation Screening Complete  Some recent data might be hidden

## 2020-02-14 NOTE — Progress Notes (Addendum)
Progress Note  Patient Name: Penny Nash Date of Encounter: 02/14/2020  [facility] Cardiologist: Lance Muss, MD new  Subjective   No chest pain, has walked without issues  Inpatient Medications    Scheduled Meds:  aspirin  81 mg Oral Daily   atorvastatin  80 mg Oral Daily   Chlorhexidine Gluconate Cloth  6 each Topical Daily   enoxaparin (LOVENOX) injection  40 mg Subcutaneous Q24H   lisinopril  2.5 mg Oral Daily   metoprolol succinate  12.5 mg Oral Daily   sodium chloride flush  3 mL Intravenous Q12H   ticagrelor  90 mg Oral BID   Continuous Infusions:  sodium chloride     PRN Meds: sodium chloride, acetaminophen, nitroGLYCERIN, ondansetron (ZOFRAN) IV, sodium chloride flush   Vital Signs    Vitals:   02/14/20 0010 02/14/20 0011 02/14/20 0403 02/14/20 0756  BP: 104/76  99/66 108/79  Pulse: 100  76 73  Resp: Temp: 98.7 F (37.1 C) 98 F (36.7 C) 98.6 F (37 C) 98.5 F (36.9 C)  TempSrc: Oral  Oral Oral  SpO2:   100% 100%  Weight:      Height:        Intake/Output Summary (Last 24 hours) at 02/14/2020 1029 Last data filed at 02/14/2020 0848 Gross per 24 hour  Intake 242 ml  Output --  Net 242 ml   Last 3 Weights 02/13/2020 02/12/2020 02/11/2020  Weight (lbs) 119 lb 4.8 oz 120 lb 13 oz 120 lb  Weight (kg) 54.114 kg 54.8 kg 54.432 kg      Telemetry    Sinus rhythm HR 80s - Personally Reviewed  ECG    No new tracings - Personally Reviewed  Physical Exam   GEN: No acute distress.   Neck: No JVD Cardiac: RRR, no murmurs, rubs, or gallops.  Respiratory: Clear to auscultation bilaterally. GI: Soft, nontender, non-distended  MS: No edema; No deformity. Neuro:  Nonfocal  Psych: Normal affect  Right radial cath site C/D/I  Labs    High Sensitivity Troponin:   Recent Labs  Lab 02/11/20 1500 02/11/20 1617 02/12/20 0832 02/12/20 1117  TROPONINIHS 13,067* 15,101* 19,315* 17,940*       Chemistry Recent Labs  Lab 02/12/20 0832 02/13/20 0727 02/14/20 0048  NA 136 136 137  K 3.6 4.0 3.6  CL 101 102 104  CO2 GLUCOSE 143* 118* 91  BUN CREATININE 0.77 0.82 0.82  CALCIUM 8.9 8.9 9.1  GFRNONAA >60 >60 >60  ANIONGAP Hematology Recent Labs  Lab 02/12/20 0328 02/13/20 0105 02/14/20 0048  WBC 13.3* 11.8* 13.4*  RBC 4.26 4.01 4.14  HGB 13.2 12.8 12.9  HCT 38.8 37.1 38.9  MCV 91.1 92.5 94.0  MCH 31.0 31.9 31.2  MCHC 34.0 34.5 33.2  RDW 12.5 12.6 12.2  PLT 200 209 175    BNPNo results for input(s): BNP, PROBNP in the last 168 hours.   DDimer No results for input(s): DDIMER in the last 168 hours.   Radiology    ECHOCARDIOGRAM COMPLETE  Result Date: 02/12/2020    ECHOCARDIOGRAM REPORT   Patient Name:   [name] Date of Exam: 02/12/2020 Medical Rec #:  [id number]          Height:       57.0 in Accession #:    [id number]         Weight:  120.8 lb Date of Birth:  11-06-1970          BSA:          1.452 m Patient Age:    49 years           BP:           94/75 mmHg Patient Gender: F                  HR:           77 bpm. Exam Location:  Inpatient Procedure: 2D Echo Indications:    acute myocardial infarction  History:        Patient has no prior history of Echocardiogram examinations.  Sonographer:    Delcie Roch Referring Phys: [id number] JAYADEEP S VARANASI IMPRESSIONS  1. There is severe hypokinesis of the mid anterior wall/septum into the apex and around the apical septum consistent with LAD infarction. Left ventricular ejection fraction, by estimation, is 40 to 45%. Left ventricular ejection fraction by 2D MOD biplane is 44.3 %. The left ventricle has mildly decreased function. The left ventricle demonstrates regional wall motion abnormalities (see scoring diagram/findings for description). Left ventricular diastolic parameters were normal.  2. Right ventricular systolic function is normal. The right ventricular size is  normal. Tricuspid regurgitation signal is inadequate for assessing PA pressure.  3. The mitral valve is grossly normal. Trivial mitral valve regurgitation. No evidence of mitral stenosis.  4. The aortic valve is tricuspid. Aortic valve regurgitation is not visualized. No aortic stenosis is present.  5. The inferior vena cava is normal in size with greater than 50% respiratory variability, suggesting right atrial pressure of 3 mmHg. Conclusion(s)/Recommendation(s): Findings consistent with ischemic cardiomyopathy. FINDINGS  Left Ventricle: There is severe hypokinesis of the mid anterior wall/septum into the apex and around the apical septum consistent with LAD infarction. Left ventricular ejection fraction, by estimation, is 40 to 45%. Left ventricular ejection fraction by  2D MOD biplane is 44.3 %. The left ventricle has mildly decreased function. The left ventricle demonstrates regional wall motion abnormalities. The left ventricular internal cavity size was normal in size. There is no left ventricular hypertrophy. Left ventricular diastolic parameters were normal.  LV Wall Scoring: The mid inferoseptal segment, apical septal segment, apical anterior segment, apical inferior segment, and apex are hypokinetic. Right Ventricle: The right ventricular size is normal. No increase in right ventricular wall thickness. Right ventricular systolic function is normal. Tricuspid regurgitation signal is inadequate for assessing PA pressure. Left Atrium: Left atrial size was normal in size. Right Atrium: Right atrial size was normal in size. Pericardium: Trivial pericardial effusion is present. Presence of pericardial fat pad. Mitral Valve: The mitral valve is grossly normal. Trivial mitral valve regurgitation. No evidence of mitral valve stenosis. Tricuspid Valve: The tricuspid valve is grossly normal. Tricuspid valve regurgitation is not demonstrated. No evidence of tricuspid stenosis. Aortic Valve: The aortic valve is  tricuspid. Aortic valve regurgitation is not visualized. No aortic stenosis is present. Pulmonic Valve: The pulmonic valve was grossly normal. Pulmonic valve regurgitation is not visualized. No evidence of pulmonic stenosis. Aorta: The aortic root and ascending aorta are structurally normal, with no evidence of dilitation. Venous: The inferior vena cava is normal in size with greater than 50% respiratory variability, suggesting right atrial pressure of 3 mmHg. IAS/Shunts: The atrial septum is grossly normal.  LEFT VENTRICLE PLAX 2D  Biplane EF (MOD) LVIDd:         4.20 cm         LV Biplane EF:   Left LVIDs:         2.70 cm                          ventricular LV PW:         1.00 cm                          ejection LV IVS:        0.80 cm                          fraction by LVOT diam:     1.70 cm                          2D MOD LV SV:         44                               biplane is LV SV Index:   30                               44.3 %. LVOT Area:     2.27 cm                                Diastology                                LV e' medial:    7.94 cm/s LV Volumes (MOD)               LV E/e' medial:  11.8 LV vol d, MOD    55.1 ml       LV e' lateral:   9.57 cm/s A2C:                           LV E/e' lateral: 9.8 LV vol d, MOD    61.4 ml A4C: LV vol s, MOD    32.5 ml A2C: LV vol s, MOD    32.4 ml A4C: LV SV MOD A2C:   22.6 ml LV SV MOD A4C:   61.4 ml LV SV MOD BP:    26.0 ml RIGHT VENTRICLE             IVC RV S prime:     17.70 cm/s  IVC diam: 1.80 cm TAPSE (M-mode): 1.9 cm LEFT ATRIUM             Index       RIGHT ATRIUM          Index LA diam:        3.30 cm 2.27 cm/m  RA Area:     7.47 cm LA Vol (A2C):   30.7 ml 21.15 ml/m RA Volume:   12.50 ml 8.61 ml/m LA Vol (A4C):   32.6 ml 22.45 ml/m LA Biplane Vol: 33.3 ml 22.94 ml/m  AORTIC VALVE LVOT Vmax:   96.00 cm/s LVOT Vmean:  66.100  cm/s LVOT VTI:    0.194 m  AORTA Ao Root diam: 2.70 cm Ao Asc diam:  2.80 cm MITRAL VALVE MV Area  (PHT): 3.48 cm     SHUNTS MV Decel Time: 218 msec     Systemic VTI:  0.19 m MV E velocity: 93.60 cm/s   Systemic Diam: 1.70 cm MV A velocity: 104.00 cm/s MV E/A ratio:  0.90 Lennie Odor MD Electronically signed by Lennie Odor MD Signature Date/Time: 02/12/2020/3:40:13 PM    Final     Cardiac Studies   Echo 02/12/20: 1. There is severe hypokinesis of the mid anterior wall/septum into the  apex and around the apical septum consistent with LAD infarction. Left  ventricular ejection fraction, by estimation, is 40 to 45%. Left  ventricular ejection fraction by 2D MOD  biplane is 44.3 %. The left ventricle has mildly decreased function. The  left ventricle demonstrates regional wall motion abnormalities (see  scoring diagram/findings for description). Left ventricular diastolic  parameters were normal.  2. Right ventricular systolic function is normal. The right ventricular  size is normal. Tricuspid regurgitation signal is inadequate for assessing  PA pressure.  3. The mitral valve is grossly normal. Trivial mitral valve  regurgitation. No evidence of mitral stenosis.  4. The aortic valve is tricuspid. Aortic valve regurgitation is not  visualized. No aortic stenosis is present.  5. The inferior vena cava is normal in size with greater than 50%  respiratory variability, suggesting right atrial pressure of 3 mmHg.   Left heart cath 02/11/20:  Separate ostia of the LAD and circumflex. No left main.  Mid LAD lesion is 100% stenosed. This was culprit lesion.  After aspiration thrombectomy, IVUS, PTCA with cutting balloon, a drug-eluting stent was successfully placed using a SYNERGY XD 2.25X24, postdilated to 2.9 mm and optimized with IVUS.  Post intervention, there is a 0% residual stenosis.  Ostial/Prox LAD lesion is 75% stenosed.  A drug-eluting stent was successfully placed using a SYNERGY XD 3.50X12, postdilated to 3.9 mm and optimized with IVUS.  Post intervention, there  is a 0% residual stenosis.  Mid LAD to Dist LAD diffusely calcified, diseased- visualized by IVUS.  There is mild left ventricular systolic dysfunction, apical hypokinesis- likely related to late presentation of acute anterior MI.  LV end diastolic pressure is normal.  The left ventricular ejection fraction is 45-50% by visual estimate.  There is no aortic valve stenosis.   Continue aggressive secondary prevention.  Beta blocker and high dose statin started.  Watch in ICU.  Check echocardiogram for evidence of LV thrombus.  Due to her late presentation, I would not be surprised if she does have some LV dysfunction.  She will need medical therapy to help improve LVEF.  No other targets for PCI as her mid to distal LAD is diffusely calcified and diseased.   Patient Profile     49 y.o. female with a history of Turner syndrome presented on 02/11/20 with anterior wall STEMI taken emergently to the cath lab.  Assessment & Plan    Anterior wall STEMI - EKG with anterior ST elevation - left heart cath with 100% stenosis in the mid LAD treated with thrombectomy, atherectomy, and DES; 75% proximal LAD stenosis treated with DES - she tolerated the procedure well - hs troponin peaked at 19315 - started on ASA and brilinta, BB, 80 mg lipitor   Ischemic cardiomyopathy Chronic systolic heart failure - EF 40-45% - GDMT limited by soft/marginal BP - was able to  start 2.5 mg lisinopril - will discharge with PRN 20 mg lasix - plan to repeat echo in 3 months   Turner syndrome - will need OP imaging of her entire aorta - no concerns for dissection today   Hyperlipidemia with LDL goal < 70 02/12/2020: Cholesterol 162; HDL 55; LDL Cholesterol 94; Triglycerides 65; VLDL 13 Continue 80 mg lipitor   Will return to work next Monday. Anticipate discharge today.   For questions or updates, please contact [facility] Please consult [email or web address] for contact info under         Signed, Marcelino Duster, PA  02/14/2020, 10:29 AM     Patient seen and examined. Agree with assessment and plan.  Patient feels well.  No recurrent chest pain.  Cath data reviewed.  Patient is status post stenting to her proximal LAD and mid LAD in the setting of anterior STEMI with delayed presentation.  Medical therapy for mild concomitant CAD.  Patient is now on aspirin/Brilinta/beta-blocker, and atorvastatin 80 mg.  EF 40 to 45% following her delayed presentation.  Peak troponin 19,315.  BP stable but on the low side.  Plan for discharge later today with outpatient initiation of ARB with possible transition to [facility] depending upon blood pressure and LV function.   Lennette Bihari, MD, [facility] 02/14/2020 12:27 PM

## 2020-02-14 NOTE — Progress Notes (Signed)
Discharge instructions provided to patient. All medications, discharge instructions, and follow up appointments discussed. IV out. Monitor off CCMD notified. Discharging home with husband. Sabra Heck, RN

## 2020-03-01 ENCOUNTER — Ambulatory Visit: Payer: BC Managed Care – PPO | Admitting: Physician Assistant

## 2020-03-01 ENCOUNTER — Other Ambulatory Visit: Payer: Self-pay

## 2020-03-01 ENCOUNTER — Encounter: Payer: Self-pay | Admitting: Physician Assistant

## 2020-03-01 VITALS — BP 100/70 | HR 62 | Ht <= 58 in | Wt 117.0 lb

## 2020-03-01 DIAGNOSIS — Q969 Turner's syndrome, unspecified: Secondary | ICD-10-CM

## 2020-03-01 DIAGNOSIS — E785 Hyperlipidemia, unspecified: Secondary | ICD-10-CM

## 2020-03-01 DIAGNOSIS — I255 Ischemic cardiomyopathy: Secondary | ICD-10-CM | POA: Diagnosis not present

## 2020-03-01 DIAGNOSIS — I25110 Atherosclerotic heart disease of native coronary artery with unstable angina pectoris: Secondary | ICD-10-CM

## 2020-03-01 DIAGNOSIS — I2109 ST elevation (STEMI) myocardial infarction involving other coronary artery of anterior wall: Secondary | ICD-10-CM

## 2020-03-01 NOTE — Progress Notes (Signed)
Cardiology Office Note:    Date:  03/01/2020   ID:  Penny Nash, DOB [birth year], MRN [id number]  PCP:  Macy MisBriscoe, Kim K, MD  [facility] Cardiologist:  Lance MussJayadeep Varanasi, MD  [facility] Electrophysiologist:  None   Chief Complaint: Hospital follow up   History of Present Illness:    Penny Nash is a 50 y.o. female with a hx of Turner syndrome who admitted 01/2020 with anterior MI. Cath with 100% stenosis in the mid LAD treated with thrombectomy, atherectomy, and DES; 75% proximal LAD stenosis treated with DES. Echo wit LVEF of 40-45%. Treated with BB ad lisinopril.   Patient presented for hospital follow-up.  She is currently walking 5000 steps a day, used to walk 10,000 steps.  Denies exertional chest tightness or shortness of breath.  Her presenting symptoms were burning sensation across chest radiating to right arm.  Denies shortness of breath, dizziness, palpitation, orthopnea, PND, syncope, lower extremity edema or melena.  She is compliant with her medication.  She is planning to see nutritionist.  Past Medical History:  Diagnosis Date  . Hip fracture (HCC) 2002  . Low bone density   . Scarlet fever   . Turner syndrome     Past Surgical History:  Procedure Laterality Date  . BREAST REDUCTION SURGERY Bilateral 2001 est  . CORONARY/GRAFT ACUTE MI REVASCULARIZATION N/A 02/11/2020   Procedure: Coronary/Graft Acute MI Revascularization;  Surgeon: Corky CraftsVaranasi, Jayadeep S, MD;  [facility];  Service: Cardiovascular;  Laterality: N/A;  . HIP FRACTURE SURGERY Left 2003 est  . INTRAVASCULAR ULTRASOUND/IVUS N/A 02/11/2020   Procedure: Intravascular Ultrasound/IVUS;  Surgeon: Corky CraftsVaranasi, Jayadeep S, MD;  [facility];  Service: Cardiovascular;  Laterality: N/A;  . LEFT HEART CATH AND CORONARY ANGIOGRAPHY N/A 02/11/2020   Procedure: LEFT HEART CATH AND CORONARY ANGIOGRAPHY;  Surgeon: Corky CraftsVaranasi, Jayadeep S, MD;  [facility];   Service: Cardiovascular;  Laterality: N/A;    Current Medications: Current Meds  Medication Sig  . aspirin EC 81 MG tablet Take 1 tablet (81 mg total) by mouth daily. Swallow whole.  Marland Kitchen. atorvastatin (LIPITOR) 80 MG tablet Take 1 tablet (80 mg total) by mouth daily.  . Calcium-Vitamin D-Vitamin K (CHEWABLE CALCIUM PO) Take 2 tablets by mouth in the morning and at bedtime.  . furosemide (LASIX) 20 MG tablet Take 1 tablet (20 mg total) by mouth daily as needed for fluid (3lb weight gain).  Marland Kitchen. lisinopril (ZESTRIL) 2.5 MG tablet Take 1 tablet (2.5 mg total) by mouth daily.  . metoprolol succinate (TOPROL-XL) 25 MG 24 hr tablet Take 0.5 tablets (12.5 mg total) by mouth daily.  . nitroGLYCERIN (NITROSTAT) 0.4 MG SL tablet Place 1 tablet (0.4 mg total) under the tongue every 5 (five) minutes as needed for chest pain.  . ticagrelor (BRILINTA) 90 MG TABS tablet Take 1 tablet (90 mg total) by mouth 2 (two) times daily.     Allergies:   Patient has no known allergies.   Social History   Socioeconomic History  . Marital status: Married    Spouse name: Not on file  . Number of children: Not on file  . Years of education: Not on file  . Highest education level: Not on file  Occupational History  . Not on file  Tobacco Use  . Smoking status: Never Smoker  . Smokeless tobacco: Never Used  Vaping Use  . Vaping Use: Never used  Substance and Sexual Activity  . Alcohol use:  Yes    Comment: Social  . Drug use: Not Currently  . Sexual activity: Yes  Other Topics Concern  . Not on file  Social History Narrative  . Not on file   Social Determinants of Health   Financial Resource Strain: Not on file  Food Insecurity: Not on file  Transportation Needs: Not on file  Physical Activity: Not on file  Stress: Not on file  Social Connections: Not on file     Family History: The patient's family history includes Breast cancer in her mother; Mitral valve prolapse in her sister.    ROS:   Please  see the history of present illness.    All other systems reviewed and are negative.   EKGs/Labs/Other Studies Reviewed:    The following studies were reviewed today: Echo 02/12/2020 1. There is severe hypokinesis of the mid anterior wall/septum into the  apex and around the apical septum consistent with LAD infarction. Left  ventricular ejection fraction, by estimation, is 40 to 45%. Left  ventricular ejection fraction by 2D MOD  biplane is 44.3 %. The left ventricle has mildly decreased function. The  left ventricle demonstrates regional wall motion abnormalities (see  scoring diagram/findings for description). Left ventricular diastolic  parameters were normal.  2. Right ventricular systolic function is normal. The right ventricular  size is normal. Tricuspid regurgitation signal is inadequate for assessing  PA pressure.  3. The mitral valve is grossly normal. Trivial mitral valve  regurgitation. No evidence of mitral stenosis.  4. The aortic valve is tricuspid. Aortic valve regurgitation is not  visualized. No aortic stenosis is present.  5. The inferior vena cava is normal in size with greater than 50%  respiratory variability, suggesting right atrial pressure of 3 mmHg.   Coronary/Graft Acute MI Revascularization  02/11/2020  Intravascular Ultrasound/IVUS  LEFT HEART CATH AND CORONARY ANGIOGRAPHY    Conclusion    Separate ostia of the LAD and circumflex. No left main.  Mid LAD lesion is 100% stenosed. This was culprit lesion.  After aspiration thrombectomy, IVUS, PTCA with cutting balloon, a drug-eluting stent was successfully placed using a SYNERGY XD 2.25X24, postdilated to 2.9 mm and optimized with IVUS.  Post intervention, there is a 0% residual stenosis.  Ostial/Prox LAD lesion is 75% stenosed.  A drug-eluting stent was successfully placed using a SYNERGY XD 3.50X12, postdilated to 3.9 mm and optimized with IVUS.  Post intervention, there is a 0% residual  stenosis.  Mid LAD to Dist LAD diffusely calcified, diseased- visualized by IVUS.  There is mild left ventricular systolic dysfunction, apical hypokinesis- likely related to late presentation of acute anterior MI.  LV end diastolic pressure is normal.  The left ventricular ejection fraction is 45-50% by visual estimate.  There is no aortic valve stenosis.   Continue aggressive secondary prevention.  Beta blocker and high dose statin started.  Watch in ICU.  Check echocardiogram for evidence of LV thrombus.  Due to her late presentation, I would not be surprised if she does have some LV dysfunction.  She will need medical therapy to help improve LVEF.  No other targets for PCI as her mid to distal LAD is diffusely calcified and diseased.  Diagnostic Dominance: Right    Intervention      EKG:  EKG is  ordered today.  The ekg ordered today demonstrates sinus rhythm, PAC, anterior ST elevation, improved from prior  Recent Labs: 02/13/2020: TSH 3.118 02/14/2020: BUN 16; Creatinine, Ser 0.82; Hemoglobin 12.9; Platelets 175;  Potassium 3.6; Sodium 137  Recent Lipid Panel    Component Value Date/Time   CHOL 162 02/12/2020 0328   TRIG 65 02/12/2020 0328   HDL 55 02/12/2020 0328   CHOLHDL 2.9 02/12/2020 0328   VLDL 13 02/12/2020 0328   LDLCALC 94 02/12/2020 0328      Physical Exam:    VS:  BP 100/70   Pulse 62   Ht 4\' 9"  (1.448 m)   Wt 117 lb (53.1 kg)   SpO2 97%   BMI 25.32 kg/m     Wt Readings from Last 3 Encounters:  03/01/20 117 lb (53.1 kg)  02/13/20 119 lb 4.8 oz (54.1 kg)     GEN: Well nourished, well developed in no acute distress HEENT: Normal NECK: No JVD; No carotid bruits LYMPHATICS: No lymphadenopathy CARDIAC: RRR, no murmurs, rubs, gallops RESPIRATORY:  Clear to auscultation without rales, wheezing or rhonchi  ABDOMEN: Soft, non-tender, non-distended MUSCULOSKELETAL:  No edema; No deformity  SKIN: Warm and dry NEUROLOGIC:  Alert and oriented x  3 PSYCHIATRIC:  Normal affect   ASSESSMENT AND PLAN:    1. CAD s/p LAD stenting -No anginal symptoms.  Continue dual antiplatelet therapy with aspirin and Brilinta.  Continue beta-blocker and statin.  2. Ischemic cardiomyopathy - Echo with LVEF of 40-45% and severe hypokinesis of the mid anterior wall/septum into the  apex and around the apical septum consistent with LAD infarction.  She is euvolemic.  Discussed heart failure symptoms in detail.  advised to limit salt intake.  Continue Toprol-XL 12.5 mg daily and lisinopril 2.5 mg daily.  Unable to uptitrate further due to soft blood pressure. Repeat echo in few months.   3. HLD - 02/12/2020: Cholesterol 162; HDL 55; LDL Cholesterol 94; Triglycerides 65; VLDL 13  -Patient will need lipid panel and LFTs in 6-week.  Patient will have this done at PCP office and fax results to 02/14/2020.  4. Turner syndrome -She was recommended to have CT of entire aorta.  Patient wants to defer until next office visit.  Reviewed alarming symptoms.  Thankfully, her blood pressure is running on the low side.  Medication Adjustments/Labs and Tests Ordered: Current medicines are reviewed at length with the patient today.  Concerns regarding medicines are outlined above.  Orders Placed This Encounter  Procedures  . EKG 12-Lead   No orders of the defined types were placed in this encounter.   Patient Instructions  Medication Instructions:  Your physician recommends that you continue on your current medications as directed. Please refer to the Current Medication list given to you today.  *If you need a refill on your cardiac medications before your next appointment, please call your pharmacy*   Lab Work: None ordered  If you have labs (blood work) drawn today and your tests are completely normal, you will receive your results only by: [place] MyChart Message (if you have MyChart) OR . A paper copy in the mail If you have any lab test that is abnormal or we need  to change your treatment, we will call you to review the results.   Testing/Procedures: None ordered   Follow-Up: At [facility], you and your health needs are our priority.  As part of our continuing mission to provide you with exceptional heart care, we have created designated Provider Care Teams.  These Care Teams include your primary Cardiologist (physician) and Advanced Practice Providers (APPs -  Physician Assistants and Nurse Practitioners) who all work together to provide you with the care  you need, when you need it.  We recommend signing up for the patient portal called "MyChart".  Sign up information is provided on this After Visit Summary.  MyChart is used to connect with patients for Virtual Visits (Telemedicine).  Patients are able to view lab/test results, encounter notes, upcoming appointments, etc.  Non-urgent messages can be sent to your provider as well.   To learn more about what you can do with MyChart, go to [email or web address].    Your next appointment:   3 month(s)  The format for your next appointment:   In Person  Provider:   You may see Lance Muss, MD or one of the following Advanced Practice Providers on your designated Care Team:    Ronie Spies, PA-C  Jacolyn Reedy, PA-C    Other Instructions      Signed, Manson Passey, [place]  03/01/2020 3:43 PM    [facility]

## 2020-03-01 NOTE — Patient Instructions (Signed)
Medication Instructions:  Your physician recommends that you continue on your current medications as directed. Please refer to the Current Medication list given to you today.  *If you need a refill on your cardiac medications before your next appointment, please call your pharmacy*   Lab Work: None ordered  If you have labs (blood work) drawn today and your tests are completely normal, you will receive your results only by: Marland Kitchen MyChart Message (if you have MyChart) OR . A paper copy in the mail If you have any lab test that is abnormal or we need to change your treatment, we will call you to review the results.   Testing/Procedures: None ordered   Follow-Up: At [facility], you and your health needs are our priority.  As part of our continuing mission to provide you with exceptional heart care, we have created designated Provider Care Teams.  These Care Teams include your primary Cardiologist (physician) and Advanced Practice Providers (APPs -  Physician Assistants and Nurse Practitioners) who all work together to provide you with the care you need, when you need it.  We recommend signing up for the patient portal called "MyChart".  Sign up information is provided on this After Visit Summary.  MyChart is used to connect with patients for Virtual Visits (Telemedicine).  Patients are able to view lab/test results, encounter notes, upcoming appointments, etc.  Non-urgent messages can be sent to your provider as well.   To learn more about what you can do with MyChart, go to [email or web address].    Your next appointment:   3 month(s)  The format for your next appointment:   In Person  Provider:   You may see Lance Muss, MD or one of the following Advanced Practice Providers on your designated Care Team:    Ronie Spies, PA-C  Jacolyn Reedy, PA-C    Other Instructions

## 2020-03-09 ENCOUNTER — Telehealth (HOSPITAL_COMMUNITY): Payer: Self-pay

## 2020-03-09 NOTE — Telephone Encounter (Signed)
Pharmacy Transitions of Care Follow-up Telephone Call  Date of discharge:  02/14/20 Discharge Diagnosis: Acute MI  How have you been since you were released from the hospital? Patient has been well, no medication issues at this time  Medication changes made at discharge: yes  Medication changes obtained and verified? yes    Medication Accessibility:  Home Pharmacy: [facility]  Was the patient provided with refills on discharged medications? yes  Have all prescriptions been transferred from [facility] to home pharmacy? yes  . Is the patient able to afford medications? yes    Medication Review:  TICAGRELOR (BRILINTA) Ticagrelor 90 mg BID 02/14/20.  - Educated patient on expected duration of therapy of aspirin with ticagrelor.  - Discussed importance of taking medication around the same time every day, - Advised patient of medications to avoid (NSAIDs, aspirin maintenance doses>100 mg daily) - Educated that Tylenol (acetaminophen) will be the preferred analgesic to prevent risk of bleeding  - Emphasized importance of monitoring for signs and symptoms of bleeding (abnormal bruising, prolonged bleeding, nose bleeds, bleeding from gums, discolored urine, black tarry stools)  - Educated patient to notify doctor if shortness of breath or abnormal heartbeat occur   Follow-up Appointments:  Saw Cardiology on 03/01/20 (Dr. Iver Nestle) for f/u and has another f/u scheduled on 06/13/20  If their condition worsens, is the pt aware to call PCP or go to the Emergency Dept.? yes  Final Patient Assessment: Patient was given 3 month fill of brilinta so she is good on refills at this time. Patient doing well and is being followed by cardiology.

## 2020-04-10 ENCOUNTER — Telehealth: Payer: Self-pay | Admitting: Interventional Cardiology

## 2020-04-10 NOTE — Telephone Encounter (Signed)
Patient notified

## 2020-04-10 NOTE — Telephone Encounter (Signed)
Pt c/o medication issue:  1. Name of Medication: ticagrelor (BRILINTA) 90 MG TABS tablet  2. How are you currently taking this medication (dosage and times per day)? 1 tablet twice a day  3. Are you having a reaction (difficulty breathing--STAT)? no  4. What is your medication issue? Patient states she walked out of the house and forgot to take her medications. She would like to know if she can take them when she gets home at 5:00 pm. She states the brilinta she takes twice a day, and would like to know if she can take it at 5pm and then 10pm. She states if she has to she will go home at lunch to take it, but she would prefer not to.

## 2020-04-10 NOTE — Telephone Encounter (Signed)
She should take her next dose at her next scheduled time and then resume twice daily dosing tomorrow.  No need to double up tonight

## 2020-05-22 ENCOUNTER — Other Ambulatory Visit: Payer: Self-pay

## 2020-05-22 MED ORDER — FUROSEMIDE 20 MG PO TABS: 20.0000 mg | ORAL_TABLET | Freq: Every day | ORAL | 10 refills | Status: DC | PRN

## 2020-06-11 NOTE — Progress Notes (Signed)
Cardiology Office Note   Date:  06/13/2020   ID:  Penny Nash, DOB Nov 25, 1970, MRN [id number]  PCP:  Macy Mis, MD    No chief complaint on file.  CAD  Wt Readings from Last 3 Encounters:  06/13/20 108 lb (49 kg)  03/01/20 117 lb (53.1 kg)  02/13/20 119 lb 4.8 oz (54.1 kg)       History of Present Illness: Penny Nash is a 50 y.o. female  with a hx of Turner syndrome who admitted 01/2020 with anterior MI.  Cath showed:  "Separate ostia of the LAD and circumflex. No left main.  Mid LAD lesion is 100% stenosed. This was culprit lesion.  After aspiration thrombectomy, IVUS, PTCA with cutting balloon, a drug-eluting stent was successfully placed using a SYNERGY XD 2.25X24, postdilated to 2.9 mm and optimized with IVUS.  Post intervention, there is a 0% residual stenosis.  Ostial/Prox LAD lesion is 75% stenosed.  A drug-eluting stent was successfully placed using a SYNERGY XD 3.50X12, postdilated to 3.9 mm and optimized with IVUS.  Post intervention, there is a 0% residual stenosis.  Mid LAD to Dist LAD diffusely calcified, diseased- visualized by IVUS.  There is mild left ventricular systolic dysfunction, apical hypokinesis- likely related to late presentation of acute anterior MI.  LV end diastolic pressure is normal.  The left ventricular ejection fraction is 45-50% by visual estimate.  There is no aortic valve stenosis.   Continue aggressive secondary prevention.  Beta blocker and high dose statin started.  Watch in ICU.  Check echocardiogram for evidence of LV thrombus.  Due to her late presentation, I would not be surprised if she does have some LV dysfunction.  She will need medical therapy to help improve LVEF.  No other targets for PCI as her mid to distal LAD is diffusely calcified and diseased."  Echo showed 40-45% in 01/2020: "There is severe hypokinesis of the mid anterior wall/septum into the  apex and around the apical septum  consistent with LAD infarction. Left  ventricular ejection fraction, by estimation, is 40 to 45%. Left  ventricular ejection fraction by 2D MOD  biplane is 44.3 %. The left ventricle has mildly decreased function. The  left ventricle demonstrates regional wall motion abnormalities (see  scoring diagram/findings for description). Left ventricular diastolic  parameters were normal.  2. Right ventricular systolic function is normal. The right ventricular  size is normal. Tricuspid regurgitation signal is inadequate for assessing  PA pressure.  3. The mitral valve is grossly normal. Trivial mitral valve  regurgitation. No evidence of mitral stenosis.  4. The aortic valve is tricuspid. Aortic valve regurgitation is not  visualized. No aortic stenosis is present.  5. The inferior vena cava is normal in size with greater than 50%  respiratory variability, suggesting right atrial pressure of 3 mmHg. "  Normal aortic root diameter in 2021.   She walks on her own daily, about 1 mile. 8K-10K steps.    Denies : Chest pain. Dizziness. Leg edema. Nitroglycerin use. Orthopnea. Palpitations. Paroxysmal nocturnal dyspnea. Shortness of breath. Syncope.   Past Medical History:  Diagnosis Date  . Hip fracture (HCC) 2002  . Low bone density   . Scarlet fever   . Turner syndrome     Past Surgical History:  Procedure Laterality Date  . BREAST REDUCTION SURGERY Bilateral 2001 est  . CORONARY/GRAFT ACUTE MI REVASCULARIZATION N/A 02/11/2020   Procedure: Coronary/Graft Acute MI Revascularization;  Surgeon: Corky Crafts, MD;  Location: [facility] INVASIVE CV LAB;  Service: Cardiovascular;  Laterality: N/A;  . HIP FRACTURE SURGERY Left 2003 est  . INTRAVASCULAR ULTRASOUND/IVUS N/A 02/11/2020   Procedure: Intravascular Ultrasound/IVUS;  Surgeon: Corky Crafts, MD;  Location: [facility] INVASIVE CV LAB;  Service: Cardiovascular;  Laterality: N/A;  . LEFT HEART CATH AND CORONARY ANGIOGRAPHY N/A 02/11/2020    Procedure: LEFT HEART CATH AND CORONARY ANGIOGRAPHY;  Surgeon: Corky Crafts, MD;  Location: [facility] INVASIVE CV LAB;  Service: Cardiovascular;  Laterality: N/A;     Current Outpatient Medications  Medication Sig Dispense Refill  . aspirin EC 81 MG tablet Take 1 tablet (81 mg total) by mouth daily. Swallow whole. 150 tablet 2  . atorvastatin (LIPITOR) 80 MG tablet Take 1 tablet (80 mg total) by mouth daily. 90 tablet 3  . Calcium-Vitamin D-Vitamin K (CHEWABLE CALCIUM PO) Take 2 tablets by mouth in the morning and at bedtime.    . furosemide (LASIX) 20 MG tablet Take 1 tablet (20 mg total) by mouth daily as needed for fluid (3lb weight gain). 30 tablet 10  . lisinopril (ZESTRIL) 2.5 MG tablet Take 1 tablet (2.5 mg total) by mouth daily. 90 tablet 3  . metoprolol succinate (TOPROL-XL) 25 MG 24 hr tablet Take 0.5 tablets (12.5 mg total) by mouth daily. 90 tablet 3  . nitroGLYCERIN (NITROSTAT) 0.4 MG SL tablet Place 1 tablet (0.4 mg total) under the tongue every 5 (five) minutes as needed for chest pain. 25 tablet 3  . ticagrelor (BRILINTA) 90 MG TABS tablet Take 1 tablet (90 mg total) by mouth 2 (two) times daily. 180 tablet 3   No current facility-administered medications for this visit.    Allergies:   Patient has no known allergies.    Social History:  The patient  reports that she has never smoked. She has never used smokeless tobacco. She reports current alcohol use. She reports previous drug use.   Family History:  The patient's family history includes Breast cancer in her mother; Mitral valve prolapse in her sister.    ROS:  Please see the history of present illness.   Otherwise, review of systems are positive for easy bruising.   All other systems are reviewed and negative.    PHYSICAL EXAM: VS:  BP 90/64   Pulse 65   Ht 4\' 9"  (1.448 m)   Wt 108 lb (49 kg)   SpO2 98%   BMI 23.37 kg/m  , BMI Body mass index is 23.37 kg/m. GEN: Well nourished, well developed, in no acute  distress  HEENT: normal  Neck: no JVD, carotid bruits, or masses Cardiac: RRR; no murmurs, rubs, or gallops,no edema  Respiratory:  clear to auscultation bilaterally, normal work of breathing GI: soft, nontender, nondistended, + BS MS: no deformity or atrophy ; 2+ right radial pulse Skin: warm and dry, no rash Neuro:  Strength and sensation are intact Psych: euthymic mood, full affect    Recent Labs: 02/13/2020: TSH 3.118 02/14/2020: BUN 16; Creatinine, Ser 0.82; Hemoglobin 12.9; Platelets 175; Potassium 3.6; Sodium 137   Lipid Panel    Component Value Date/Time   CHOL 162 02/12/2020 0328   TRIG 65 02/12/2020 0328   HDL 55 02/12/2020 0328   CHOLHDL 2.9 02/12/2020 0328   VLDL 13 02/12/2020 0328   LDLCALC 94 02/12/2020 0328     Other studies Reviewed: Additional studies/ records that were reviewed today with results demonstrating: labs reviewed from [facility].   ASSESSMENT AND PLAN:  1. CAD/Old MI: No  bleeding problems. Continue aspirin and Brilinta for now.  WOuld chage to clopidogrel after 1 year given the small. diffusely diseased distal LAD. 2. Hyperlipidemia: LDL 55 in 3/22. Continue atorvastatin. Whole food, plant based diet.  3. Chronic systolic heart failure: Appears euvolemic.   4. Turner syndrome: Trileaflet aortic valve.  Reviewed images with the patient.    Current medicines are reviewed at length with the patient today.  The patient concerns regarding her medicines were addressed.  The following changes have been made:  No change  Labs/ tests ordered today include:  No orders of the defined types were placed in this encounter.   Recommend 150 minutes/week of aerobic exercise Low fat, low carb, high fiber diet recommended  Disposition:   FU in 1 year   Signed, Lance Muss, MD  06/13/2020 3:58 PM    [facility] [address] [place], [place], [place]  [address] Phone: [phone]; Fax: [phone]

## 2020-06-13 ENCOUNTER — Encounter: Payer: Self-pay | Admitting: Interventional Cardiology

## 2020-06-13 ENCOUNTER — Other Ambulatory Visit: Payer: Self-pay

## 2020-06-13 ENCOUNTER — Ambulatory Visit: Payer: BC Managed Care – PPO | Admitting: Interventional Cardiology

## 2020-06-13 VITALS — BP 90/64 | HR 65 | Ht <= 58 in | Wt 108.0 lb

## 2020-06-13 DIAGNOSIS — I2109 ST elevation (STEMI) myocardial infarction involving other coronary artery of anterior wall: Secondary | ICD-10-CM | POA: Diagnosis not present

## 2020-06-13 DIAGNOSIS — E785 Hyperlipidemia, unspecified: Secondary | ICD-10-CM | POA: Diagnosis not present

## 2020-06-13 DIAGNOSIS — I255 Ischemic cardiomyopathy: Secondary | ICD-10-CM

## 2020-06-13 DIAGNOSIS — I25110 Atherosclerotic heart disease of native coronary artery with unstable angina pectoris: Secondary | ICD-10-CM | POA: Diagnosis not present

## 2020-06-13 DIAGNOSIS — Q969 Turner's syndrome, unspecified: Secondary | ICD-10-CM

## 2020-06-13 NOTE — Patient Instructions (Signed)
Medication Instructions:  Your physician recommends that you continue on your current medications as directed. Please refer to the Current Medication list given to you today.  *If you need a refill on your cardiac medications before your next appointment, please call your pharmacy*   Lab Work: none If you have labs (blood work) drawn today and your tests are completely normal, you will receive your results only by: Marland Kitchen MyChart Message (if you have MyChart) OR . A paper copy in the mail If you have any lab test that is abnormal or we need to change your treatment, we will call you to review the results.   Testing/Procedures: none   Follow-Up: At [facility], you and your health needs are our priority.  As part of our continuing mission to provide you with exceptional heart care, we have created designated Provider Care Teams.  These Care Teams include your primary Cardiologist (physician) and Advanced Practice Providers (APPs -  Physician Assistants and Nurse Practitioners) who all work together to provide you with the care you need, when you need it.  We recommend signing up for the patient portal called "MyChart".  Sign up information is provided on this After Visit Summary.  MyChart is used to connect with patients for Virtual Visits (Telemedicine).  Patients are able to view lab/test results, encounter notes, upcoming appointments, etc.  Non-urgent messages can be sent to your provider as well.   To learn more about what you can do with MyChart, go to [email or web address].    Your next appointment:   6-7 months  The format for your next appointment:   In Person  Provider:   You may see Lance Muss, MD or one of the following Advanced Practice Providers on your designated Care Team:    Ronie Spies, PA-C  Jacolyn Reedy, PA-C    Other Instructions

## 2021-01-05 ENCOUNTER — Encounter: Payer: Self-pay | Admitting: Interventional Cardiology

## 2021-01-05 ENCOUNTER — Other Ambulatory Visit: Payer: Self-pay

## 2021-01-05 ENCOUNTER — Ambulatory Visit: Payer: BC Managed Care – PPO | Admitting: Interventional Cardiology

## 2021-01-05 VITALS — BP 98/64 | HR 56 | Ht <= 58 in | Wt 106.8 lb

## 2021-01-05 DIAGNOSIS — I5022 Chronic systolic (congestive) heart failure: Secondary | ICD-10-CM | POA: Diagnosis not present

## 2021-01-05 DIAGNOSIS — R7989 Other specified abnormal findings of blood chemistry: Secondary | ICD-10-CM

## 2021-01-05 DIAGNOSIS — E785 Hyperlipidemia, unspecified: Secondary | ICD-10-CM

## 2021-01-05 DIAGNOSIS — Q969 Turner's syndrome, unspecified: Secondary | ICD-10-CM | POA: Diagnosis not present

## 2021-01-05 DIAGNOSIS — I25118 Atherosclerotic heart disease of native coronary artery with other forms of angina pectoris: Secondary | ICD-10-CM

## 2021-01-05 MED ORDER — CLOPIDOGREL BISULFATE 75 MG PO TABS: 75.0000 mg | ORAL_TABLET | Freq: Every day | ORAL | 3 refills | Status: DC

## 2021-01-05 NOTE — Patient Instructions (Signed)
Medication Instructions:  Your physician has recommended you make the following change in your medication: Doreatha Martin current supply of Brilinta.  When finished start Clopidogrel 75 mg by mouth daily  *If you need a refill on your cardiac medications before your next appointment, please call your pharmacy*   Lab Work: none If you have labs (blood work) drawn today and your tests are completely normal, you will receive your results only by: MyChart Message (if you have MyChart) OR A paper copy in the mail If you have any lab test that is abnormal or we need to change your treatment, we will call you to review the results.   Testing/Procedures: none   Follow-Up: At [facility], you and your health needs are our priority.  As part of our continuing mission to provide you with exceptional heart care, we have created designated Provider Care Teams.  These Care Teams include your primary Cardiologist (physician) and Advanced Practice Providers (APPs -  Physician Assistants and Nurse Practitioners) who all work together to provide you with the care you need, when you need it.  We recommend signing up for the patient portal called "MyChart".  Sign up information is provided on this After Visit Summary.  MyChart is used to connect with patients for Virtual Visits (Telemedicine).  Patients are able to view lab/test results, encounter notes, upcoming appointments, etc.  Non-urgent messages can be sent to your provider as well.   To learn more about what you can do with MyChart, go to [email or web address].    Your next appointment:   12 month(s)  The format for your next appointment:   In Person  Provider:   Lance Muss, MD     Other Instructions

## 2021-01-05 NOTE — Progress Notes (Signed)
Cardiology Office Note   Date:  01/05/2021   ID:  Penny Nash, DOB 24-Jan-1971, MRN [id number]  PCP:  Penny Mis, MD    No chief complaint on file.  CAD  Wt Readings from Last 3 Encounters:  01/05/21 106 lb 12.8 oz (48.4 kg)  06/13/20 108 lb (49 kg)  03/01/20 117 lb (53.1 kg)       History of Present Illness: Penny Nash is a 50 y.o. female  with a hx of Turner syndrome who admitted 01/2020 with anterior MI.   Cath showed: "Separate ostia of the LAD and circumflex. No left main. Mid LAD lesion is 100% stenosed. This was culprit lesion. After aspiration thrombectomy, IVUS, PTCA with cutting balloon, a drug-eluting stent was successfully placed using a SYNERGY XD 2.25X24, postdilated to 2.9 mm and optimized with IVUS. Post intervention, there is a 0% residual stenosis. Ostial/Prox LAD lesion is 75% stenosed. A drug-eluting stent was successfully placed using a SYNERGY XD 3.50X12, postdilated to 3.9 mm and optimized with IVUS. Post intervention, there is a 0% residual stenosis. Mid LAD to Dist LAD diffusely calcified, diseased- visualized by IVUS. There is mild left ventricular systolic dysfunction, apical hypokinesis- likely related to late presentation of acute anterior MI. LV end diastolic pressure is normal. The left ventricular ejection fraction is 45-50% by visual estimate. There is no aortic valve stenosis.   Continue aggressive secondary prevention.  Beta blocker and high dose statin started.  Watch in ICU.  Check echocardiogram for evidence of LV thrombus.  Due to her late presentation, I would not be surprised if she does have some LV dysfunction.  She will need medical therapy to help improve LVEF.  No other targets for PCI as her mid to distal LAD is diffusely calcified and diseased."   Echo showed 40-45% in 01/2020: "There is severe hypokinesis of the mid anterior wall/septum into the  apex and around the apical septum consistent with LAD  infarction. Left  ventricular ejection fraction, by estimation, is 40 to 45%. Left  ventricular ejection fraction by 2D MOD  biplane is 44.3 %. The left ventricle has mildly decreased function. The  left ventricle demonstrates regional wall motion abnormalities (see  scoring diagram/findings for description). Left ventricular diastolic  parameters were normal.   2. Right ventricular systolic function is normal. The right ventricular  size is normal. Tricuspid regurgitation signal is inadequate for assessing  PA pressure.   3. The mitral valve is grossly normal. Trivial mitral valve  regurgitation. No evidence of mitral stenosis.   4. The aortic valve is tricuspid. Aortic valve regurgitation is not  visualized. No aortic stenosis is present.   5. The inferior vena cava is normal in size with greater than 50%  respiratory variability, suggesting right atrial pressure of 3 mmHg. "   Normal aortic root diameter in 2021.   She does walk daily.  She is walking stairs frequently.  She has a 7 year old child at home as well  Denies : Chest pain. Dizziness. Leg edema. Nitroglycerin use. Orthopnea. Palpitations. Paroxysmal nocturnal dyspnea. Shortness of breath. Syncope.      Has not needed any Lasix.  She has improved her diet.  Eating more vegetables.      Past Medical History:  Diagnosis Date   Hip fracture ([facility]) 2002   Low bone density    Scarlet fever    Turner syndrome     Past Surgical History:  Procedure Laterality Date  BREAST REDUCTION SURGERY Bilateral 2001 est   CORONARY/GRAFT ACUTE MI REVASCULARIZATION N/A 02/11/2020   Procedure: Coronary/Graft Acute MI Revascularization;  Surgeon: Corky Crafts, MD;  Location: [facility] INVASIVE CV LAB;  Service: Cardiovascular;  Laterality: N/A;   HIP FRACTURE SURGERY Left 2003 est   INTRAVASCULAR ULTRASOUND/IVUS N/A 02/11/2020   Procedure: Intravascular Ultrasound/IVUS;  Surgeon: Corky Crafts, MD;  Location: [facility] INVASIVE CV  LAB;  Service: Cardiovascular;  Laterality: N/A;   LEFT HEART CATH AND CORONARY ANGIOGRAPHY N/A 02/11/2020   Procedure: LEFT HEART CATH AND CORONARY ANGIOGRAPHY;  Surgeon: Corky Crafts, MD;  Location: [facility] INVASIVE CV LAB;  Service: Cardiovascular;  Laterality: N/A;     Current Outpatient Medications  Medication Sig Dispense Refill   aspirin EC 81 MG tablet Take 1 tablet (81 mg total) by mouth daily. Swallow whole. 150 tablet 2   atorvastatin (LIPITOR) 80 MG tablet Take 1 tablet (80 mg total) by mouth daily. 90 tablet 3   Calcium-Vitamin D-Vitamin K (CHEWABLE CALCIUM PO) Take 2 tablets by mouth in the morning and at bedtime.     furosemide (LASIX) 20 MG tablet Take 1 tablet (20 mg total) by mouth daily as needed for fluid (3lb weight gain). 30 tablet 10   lisinopril (ZESTRIL) 2.5 MG tablet Take 1 tablet (2.5 mg total) by mouth daily. 90 tablet 3   metoprolol succinate (TOPROL-XL) 25 MG 24 hr tablet Take 0.5 tablets (12.5 mg total) by mouth daily. 90 tablet 3   nitroGLYCERIN (NITROSTAT) 0.4 MG SL tablet Place 1 tablet (0.4 mg total) under the tongue every 5 (five) minutes as needed for chest pain. 25 tablet 3   ticagrelor (BRILINTA) 90 MG TABS tablet Take 1 tablet (90 mg total) by mouth 2 (two) times daily. 180 tablet 3   No current facility-administered medications for this visit.    Allergies:   Patient has no known allergies.    Social History:  The patient  reports that she has never smoked. She has never used smokeless tobacco. She reports current alcohol use. She reports that she does not currently use drugs.   Family History:  The patient's family history includes Breast cancer in her mother; Mitral valve prolapse in her sister.    ROS:  Please see the history of present illness.   Otherwise, review of systems are positive for intentional weight loss.   All other systems are reviewed and negative.    PHYSICAL EXAM: VS:  BP 98/64   Pulse (!) 56   Ht 4\' 9"  (1.448 m)   Wt  106 lb 12.8 oz (48.4 kg)   SpO2 96%   BMI 23.11 kg/m  , BMI Body mass index is 23.11 kg/m. GEN: Well nourished, well developed, in no acute distress HEENT: normal Neck: no JVD, carotid bruits, or masses Cardiac: RRR; no murmurs, rubs, or gallops,no edema  Respiratory:  clear to auscultation bilaterally, normal work of breathing GI: soft, nontender, nondistended, + BS MS: no deformity or atrophy Skin: warm and dry, no rash Neuro:  Strength and sensation are intact Psych: euthymic mood, full affect   EKG:   The ekg ordered today demonstrates NSR, septal Q waves, no ST changes   Recent Labs: 02/13/2020: TSH 3.118 02/14/2020: BUN 16; Creatinine, Ser 0.82; Hemoglobin 12.9; Platelets 175; Potassium 3.6; Sodium 137   Lipid Panel    Component Value Date/Time   CHOL 162 02/12/2020 0328   TRIG 65 02/12/2020 0328   HDL 55 02/12/2020 0328   CHOLHDL 2.9  02/12/2020 0328   VLDL 13 02/12/2020 0328   LDLCALC 94 02/12/2020 0328     Other studies Reviewed: Additional studies/ records that were reviewed today with results demonstrating: labs from PMD reviewed.  They are present in care everywhere.   ASSESSMENT AND PLAN:  CAD/Old MI: No angina on medical therapy.  Continue aggressive secondary prevention.  No bleeding issues.  [employer] supply.  Then start clopidogrel 75 mg daily along with aspirin 81 mg daily.  If she has bleeding problems, could stop aspirin and continue clopidogrel monotherapy. Hyperlipidemia: Avoid processed foods.  Whole food, plant based diet. LDL 55 in March 2022.  Chronic systolic heart failure: Mild LV dysfunction by most recent echocardiogram.  Appears euvolemic.  Normal renal function in 10/2020. Turner's syndrome: Trileaflet valve.  Elevated LFTs- ALT mildly elevated.  Has had fatty liver in the past.  Healthy diet as noted above.    Current medicines are reviewed at length with the patient today.  The patient concerns regarding her medicines were  addressed.  The following changes have been made:  No change  Labs/ tests ordered today include:  No orders of the defined types were placed in this encounter.   Recommend 150 minutes/week of aerobic exercise Low fat, low carb, high fiber diet recommended  Disposition:   FU in 1 year   Signed, Lance Muss, MD  01/05/2021 2:12 PM    [facility] [address] [place], [place], [place]  [address] Phone: [phone]; Fax: [phone]

## 2021-01-18 ENCOUNTER — Other Ambulatory Visit: Payer: Self-pay

## 2021-01-18 MED ORDER — LISINOPRIL 2.5 MG PO TABS: 2.5000 mg | ORAL_TABLET | Freq: Every day | ORAL | 3 refills | Status: DC

## 2021-01-18 MED ORDER — ATORVASTATIN CALCIUM 80 MG PO TABS: 80.0000 mg | ORAL_TABLET | Freq: Every day | ORAL | 3 refills | Status: DC

## 2021-05-01 ENCOUNTER — Encounter: Payer: Self-pay | Admitting: Interventional Cardiology

## 2021-05-01 MED ORDER — METOPROLOL SUCCINATE ER 25 MG PO TB24: 12.5000 mg | ORAL_TABLET | Freq: Every day | ORAL | 3 refills | Status: DC

## 2021-05-04 ENCOUNTER — Ambulatory Visit: Payer: BC Managed Care – PPO | Admitting: Interventional Cardiology

## 2021-10-11 ENCOUNTER — Other Ambulatory Visit: Payer: Self-pay | Admitting: Interventional Cardiology

## 2022-01-07 ENCOUNTER — Other Ambulatory Visit: Payer: Self-pay | Admitting: Interventional Cardiology

## 2022-02-28 NOTE — Progress Notes (Signed)
Cardiology Office Note   Date:  03/01/2022   ID:  Penny Nash, DOB 01/22/1971, MRN [id number]  PCP:  Katherina Mires, MD    No chief complaint on file.  CAD  Wt Readings from Last 3 Encounters:  03/01/22 109 lb (49.4 kg)  01/05/21 106 lb 12.8 oz (48.4 kg)  06/13/20 108 lb (49 kg)       History of Present Illness: Penny Nash is a 52 y.o. female  with a hx of Turner syndrome who admitted 01/2020 with anterior MI.   Cath showed: "Separate ostia of the LAD and circumflex. No left main. Mid LAD lesion is 100% stenosed. This was culprit lesion. After aspiration thrombectomy, IVUS, PTCA with cutting balloon, a drug-eluting stent was successfully placed using a SYNERGY XD 2.25X24, postdilated to 2.9 mm and optimized with IVUS. Post intervention, there is a 0% residual stenosis. Ostial/Prox LAD lesion is 75% stenosed. A drug-eluting stent was successfully placed using a SYNERGY XD 3.50X12, postdilated to 3.9 mm and optimized with IVUS. Post intervention, there is a 0% residual stenosis. Mid LAD to Dist LAD diffusely calcified, diseased- visualized by IVUS. There is mild left ventricular systolic dysfunction, apical hypokinesis- likely related to late presentation of acute anterior MI. LV end diastolic pressure is normal. The left ventricular ejection fraction is 45-50% by visual estimate. There is no aortic valve stenosis.   Continue aggressive secondary prevention.  Beta blocker and high dose statin started.  Watch in ICU.  Check echocardiogram for evidence of LV thrombus.  Due to her late presentation, I would not be surprised if she does have some LV dysfunction.  She will need medical therapy to help improve LVEF.  No other targets for PCI as her mid to distal LAD is diffusely calcified and diseased."   Echo showed 40-45% in 01/2020: "There is severe hypokinesis of the mid anterior wall/septum into the  apex and around the apical septum consistent with LAD  infarction. Left  ventricular ejection fraction, by estimation, is 40 to 45%. Left  ventricular ejection fraction by 2D MOD  biplane is 44.3 %. The left ventricle has mildly decreased function. The  left ventricle demonstrates regional wall motion abnormalities (see  scoring diagram/findings for description). Left ventricular diastolic  parameters were normal.   2. Right ventricular systolic function is normal. The right ventricular  size is normal. Tricuspid regurgitation signal is inadequate for assessing  PA pressure.   3. The mitral valve is grossly normal. Trivial mitral valve  regurgitation. No evidence of mitral stenosis.   4. The aortic valve is tricuspid. Aortic valve regurgitation is not  visualized. No aortic stenosis is present.   5. The inferior vena cava is normal in size with greater than 50%  respiratory variability, suggesting right atrial pressure of 3 mmHg. "   Normal aortic root diameter in 2021.   As of 2024: Walks nearly daily, 30 minutes.  Works at [employer].  7000-10,000.    Denies : Chest pain. Dizziness. Leg edema. Nitroglycerin use. Orthopnea. Palpitations. Paroxysmal nocturnal dyspnea. Shortness of breath. Syncope.        Past Medical History:  Diagnosis Date   Hip fracture ([place]) 2002   Low bone density    Scarlet fever    Turner syndrome     Past Surgical History:  Procedure Laterality Date   BREAST REDUCTION SURGERY Bilateral 2001 est   CORONARY/GRAFT ACUTE MI REVASCULARIZATION N/A 02/11/2020   Procedure: Coronary/Graft Acute MI Revascularization;  Surgeon: Irish Lack,  Donnie Coffin, MD;  Location: [facility] INVASIVE CV LAB;  Service: Cardiovascular;  Laterality: N/A;   HIP FRACTURE SURGERY Left 2003 est   INTRAVASCULAR ULTRASOUND/IVUS N/A 02/11/2020   Procedure: Intravascular Ultrasound/IVUS;  Surgeon: Corky Crafts, MD;  Location: [facility] INVASIVE CV LAB;  Service: Cardiovascular;  Laterality: N/A;   LEFT HEART CATH AND CORONARY ANGIOGRAPHY N/A 02/11/2020    Procedure: LEFT HEART CATH AND CORONARY ANGIOGRAPHY;  Surgeon: Corky Crafts, MD;  Location: [facility] INVASIVE CV LAB;  Service: Cardiovascular;  Laterality: N/A;     Current Outpatient Medications  Medication Sig Dispense Refill   aspirin EC 81 MG tablet Take 81 mg by mouth daily. Swallow whole.     atorvastatin (LIPITOR) 80 MG tablet TAKE 1 TABLET BY MOUTH EVERY DAY 90 tablet 0   Calcium-Vitamin D-Vitamin K (CHEWABLE CALCIUM PO) Take 2 tablets by mouth in the morning and at bedtime.     clopidogrel (PLAVIX) 75 MG tablet TAKE 1 TABLET BY MOUTH EVERY DAY 90 tablet 1   furosemide (LASIX) 20 MG tablet Take 1 tablet (20 mg total) by mouth daily as needed for fluid (3lb weight gain). 30 tablet 10   lisinopril (ZESTRIL) 2.5 MG tablet TAKE 1 TABLET BY MOUTH EVERY DAY 90 tablet 0   metoprolol succinate (TOPROL-XL) 25 MG 24 hr tablet Take 0.5 tablets (12.5 mg total) by mouth daily. 45 tablet 3   nitroGLYCERIN (NITROSTAT) 0.4 MG SL tablet Place 1 tablet (0.4 mg total) under the tongue every 5 (five) minutes as needed for chest pain. 25 tablet 3   No current facility-administered medications for this visit.    Allergies:   Patient has no known allergies.    Social History:  The patient  reports that she has never smoked. She has never used smokeless tobacco. She reports current alcohol use. She reports that she does not currently use drugs.   Family History:  The patient's family history includes Breast cancer in her mother; Mitral valve prolapse in her sister.    ROS:  Please see the history of present illness.   Otherwise, review of systems are positive for oseoporosis.   All other systems are reviewed and negative.    PHYSICAL EXAM: VS:  BP 96/62   Pulse (!) 58   Ht 4\' 10"  (1.473 m)   Wt 109 lb (49.4 kg)   SpO2 93%   BMI 22.78 kg/m  , BMI Body mass index is 22.78 kg/m. GEN: Well nourished, well developed, in no acute distress HEENT: normal Neck: no JVD, carotid bruits, or  masses Cardiac: RRR; no murmurs, rubs, or gallops,no edema  Respiratory:  clear to auscultation bilaterally, normal work of breathing GI: soft, nontender, nondistended, + BS MS: no deformity or atrophy Skin: warm and dry, no rash Neuro:  Strength and sensation are intact Psych: euthymic mood, full affect   EKG:   The ekg ordered today demonstrates NSR, septal MI, nonspecific ST changes   Recent Labs: No results found for requested labs within last 365 days.   Lipid Panel    Component Value Date/Time   CHOL 162 02/12/2020 0328   TRIG 65 02/12/2020 0328   HDL 55 02/12/2020 0328   CHOLHDL 2.9 02/12/2020 0328   VLDL 13 02/12/2020 0328   LDLCALC 94 02/12/2020 0328     Other studies Reviewed: Additional studies/ records that were reviewed today with results demonstrating: Cr 0.72, Hemoglobin 14.4 in September 2023.  Lipids: Total cholesterol 129 HDL 64 LDL 55 triglycerides 38  ASSESSMENT AND PLAN:  CAD/Old MI: No angina.  Continue aggressive secondary prevention. No bleeding problems.  Hyperlipidemia: The current medical regimen is effective;  continue present plan and medications. Chronic systolic heart failure: Appears euvolemic.  Turner syndrome: Trileaflet aortic valve noted in the past. Prior elevated LFTs with ALT being mildly elevated.  Has had fatty liver in the past. PreDM: A1c 5.8 in September 2023.  ,  Whole food plant-based diet avoiding processed foods.  Avoiding concentrated sweets.Increasing fiber intake. Elevated LFT: ALT 43.  She was told it was caused by fatty liver.  Also has gallstones In terms of the treatment for osteoporosis, let us know what medication might be tried.  We can check with our pharmacist to make sure that there are no drug interactions.  Current medicines are reviewed at length with the patient today.  The patient concerns regarding her medicines were addressed.  The following changes have been made:  No change  Labs/ tests ordered today  include:  No orders of the defined types were placed in this encounter.   Recommend 150 minutes/week of aerobic exercise Low fat, low carb, high fiber diet recommended  Disposition:   FU in 1 year   Signed, Larae Grooms, MD  03/01/2022 9:00 AM    [facility] [place], [place], [place]  [address] Phone: [phone]; Fax: [phone]

## 2022-03-01 ENCOUNTER — Ambulatory Visit: Payer: BC Managed Care – PPO | Attending: Interventional Cardiology | Admitting: Interventional Cardiology

## 2022-03-01 ENCOUNTER — Encounter: Payer: Self-pay | Admitting: Interventional Cardiology

## 2022-03-01 VITALS — BP 96/62 | HR 58 | Ht <= 58 in | Wt 109.0 lb

## 2022-03-01 DIAGNOSIS — I255 Ischemic cardiomyopathy: Secondary | ICD-10-CM | POA: Diagnosis not present

## 2022-03-01 DIAGNOSIS — I25118 Atherosclerotic heart disease of native coronary artery with other forms of angina pectoris: Secondary | ICD-10-CM | POA: Diagnosis not present

## 2022-03-01 DIAGNOSIS — I5022 Chronic systolic (congestive) heart failure: Secondary | ICD-10-CM | POA: Diagnosis not present

## 2022-03-01 DIAGNOSIS — E785 Hyperlipidemia, unspecified: Secondary | ICD-10-CM | POA: Diagnosis not present

## 2022-03-01 NOTE — Patient Instructions (Signed)
Medication Instructions:  Your physician recommends that you continue on your current medications as directed. Please refer to the Current Medication list given to you today.  *If you need a refill on your cardiac medications before your next appointment, please call your pharmacy*   Lab Work: none If you have labs (blood work) drawn today and your tests are completely normal, you will receive your results only by: MyChart Message (if you have MyChart) OR A paper copy in the mail If you have any lab test that is abnormal or we need to change your treatment, we will call you to review the results.   Testing/Procedures: none   Follow-Up: At [facility], you and your health needs are our priority.  As part of our continuing mission to provide you with exceptional heart care, we have created designated Provider Care Teams.  These Care Teams include your primary Cardiologist (physician) and Advanced Practice Providers (APPs -  Physician Assistants and Nurse Practitioners) who all work together to provide you with the care you need, when you need it.  We recommend signing up for the patient portal called "MyChart".  Sign up information is provided on this After Visit Summary.  MyChart is used to connect with patients for Virtual Visits (Telemedicine).  Patients are able to view lab/test results, encounter notes, upcoming appointments, etc.  Non-urgent messages can be sent to your provider as well.   To learn more about what you can do with MyChart, go to [email or web address].    Your next appointment:   12 month(s)  Provider:   Jayadeep Varanasi, MD     Other Instructions  High-Fiber Eating Plan Fiber, also called dietary fiber, is a type of carbohydrate. It is found foods such as fruits, vegetables, whole grains, and beans. A high-fiber diet can have many health benefits. Your health care provider may recommend a high-fiber diet to help: Prevent constipation. Fiber can make  your bowel movements more regular. Lower your cholesterol. Relieve the following conditions: Inflammation of veins in the anus (hemorrhoids). Inflammation of specific areas of the digestive tract (uncomplicated diverticulosis). A problem of the large intestine, also called the colon, that sometimes causes pain and diarrhea (irritable bowel syndrome, or IBS). Prevent overeating as part of a weight-loss plan. Prevent heart disease, type 2 diabetes, and certain cancers. What are tips for following this plan? Reading food labels  Check the nutrition facts label on food products for the amount of dietary fiber. Choose foods that have 5 grams of fiber or more per serving. The goals for recommended daily fiber intake include: Men (age 50 or younger): 34-38 g. Men (over age 50): 28-34 g. Women (age 50 or younger): 25-28 g. Women (over age 50): 22-25 g. Your daily fiber goal is _____________ g. Shopping Choose whole fruits and vegetables instead of processed forms, such as apple juice or applesauce. Choose a wide variety of high-fiber foods such as avocados, lentils, oats, and kidney beans. Read the nutrition facts label of the foods you choose. Be aware of foods with added fiber. These foods often have high sugar and sodium amounts per serving. Cooking Use whole-grain flour for baking and cooking. Cook with brown rice instead of white rice. Meal planning Start the day with a breakfast that is high in fiber, such as a cereal that contains 5 g of fiber or more per serving. Eat breads and cereals that are made with whole-grain flour instead of refined flour or white flour. Eat brown rice, bulgur wheat,   or millet instead of white rice. Use beans in place of meat in soups, salads, and pasta dishes. Be sure that half of the grains you eat each day are whole grains. General information You can get the recommended daily intake of dietary fiber by: Eating a variety of fruits, vegetables, grains,  nuts, and beans. Taking a fiber supplement if you are not able to take in enough fiber in your diet. It is better to get fiber through food than from a supplement. Gradually increase how much fiber you consume. If you increase your intake of dietary fiber too quickly, you may have bloating, cramping, or gas. Drink plenty of water to help you digest fiber. Choose high-fiber snacks, such as berries, raw vegetables, nuts, and popcorn. What foods should I eat? Fruits Berries. Pears. Apples. Oranges. Avocado. Prunes and raisins. Dried figs. Vegetables Sweet potatoes. Spinach. Kale. Artichokes. Cabbage. Broccoli. Cauliflower. Green peas. Carrots. Squash. Grains Whole-grain breads. Multigrain cereal. Oats and oatmeal. Brown rice. Barley. Bulgur wheat. Millet. Quinoa. Bran muffins. Popcorn. Rye wafer crackers. Meats and other proteins Navy beans, kidney beans, and pinto beans. Soybeans. Split peas. Lentils. Nuts and seeds. Dairy Fiber-fortified yogurt. Beverages Fiber-fortified soy milk. Fiber-fortified orange juice. Other foods Fiber bars. The items listed above may not be a complete list of recommended foods and beverages. Contact a dietitian for more information. What foods should I avoid? Fruits Fruit juice. Cooked, strained fruit. Vegetables Fried potatoes. Canned vegetables. Well-cooked vegetables. Grains White bread. Pasta made with refined flour. White rice. Meats and other proteins Fatty cuts of meat. Fried chicken or fried fish. Dairy Milk. Yogurt. Cream cheese. Sour cream. Fats and oils Butters. Beverages Soft drinks. Other foods Cakes and pastries. The items listed above may not be a complete list of foods and beverages to avoid. Talk with your dietitian about what choices are best for you. Summary Fiber is a type of carbohydrate. It is found in foods such as fruits, vegetables, whole grains, and beans. A high-fiber diet has many benefits. It can help to prevent  constipation, lower blood cholesterol, aid weight loss, and reduce your risk of heart disease, diabetes, and certain cancers. Increase your intake of fiber gradually. Increasing fiber too quickly may cause cramping, bloating, and gas. Drink plenty of water while you increase the amount of fiber you consume. The best sources of fiber include whole fruits and vegetables, whole grains, nuts, seeds, and beans. This information is not intended to replace advice given to you by your health care provider. Make sure you discuss any questions you have with your health care provider. Document Revised: 06/10/2019 Document Reviewed: 06/10/2019 Elsevier Patient Education  2023 Elsevier Inc.   

## 2022-04-08 ENCOUNTER — Other Ambulatory Visit: Payer: Self-pay | Admitting: Interventional Cardiology

## 2022-04-15 ENCOUNTER — Other Ambulatory Visit: Payer: Self-pay | Admitting: Interventional Cardiology

## 2022-04-15 NOTE — Telephone Encounter (Signed)
Rx refill sent to pharmacy. 

## 2022-05-10 ENCOUNTER — Encounter: Payer: Self-pay | Admitting: Interventional Cardiology

## 2022-09-26 ENCOUNTER — Ambulatory Visit: Payer: BC Managed Care – PPO | Admitting: Podiatry

## 2022-09-26 ENCOUNTER — Ambulatory Visit (INDEPENDENT_AMBULATORY_CARE_PROVIDER_SITE_OTHER): Payer: BC Managed Care – PPO

## 2022-09-26 VITALS — BP 113/68

## 2022-09-26 DIAGNOSIS — M21611 Bunion of right foot: Secondary | ICD-10-CM

## 2022-09-26 DIAGNOSIS — M2041 Other hammer toe(s) (acquired), right foot: Secondary | ICD-10-CM

## 2022-09-26 DIAGNOSIS — M810 Age-related osteoporosis without current pathological fracture: Secondary | ICD-10-CM

## 2022-09-26 DIAGNOSIS — M2011 Hallux valgus (acquired), right foot: Secondary | ICD-10-CM

## 2022-09-26 DIAGNOSIS — M9689 Other intraoperative and postprocedural complications and disorders of the musculoskeletal system: Secondary | ICD-10-CM | POA: Diagnosis not present

## 2022-09-26 IMAGING — CR DG CHEST 2V
2 series · 2 of 2 positions shown · non-contrast
Comparison: X-ray thoracic spine 06/10/2014

CLINICAL DATA: Chest pain, arm tingling

EXAM:
CHEST - 2 VIEW

[w chest pa]
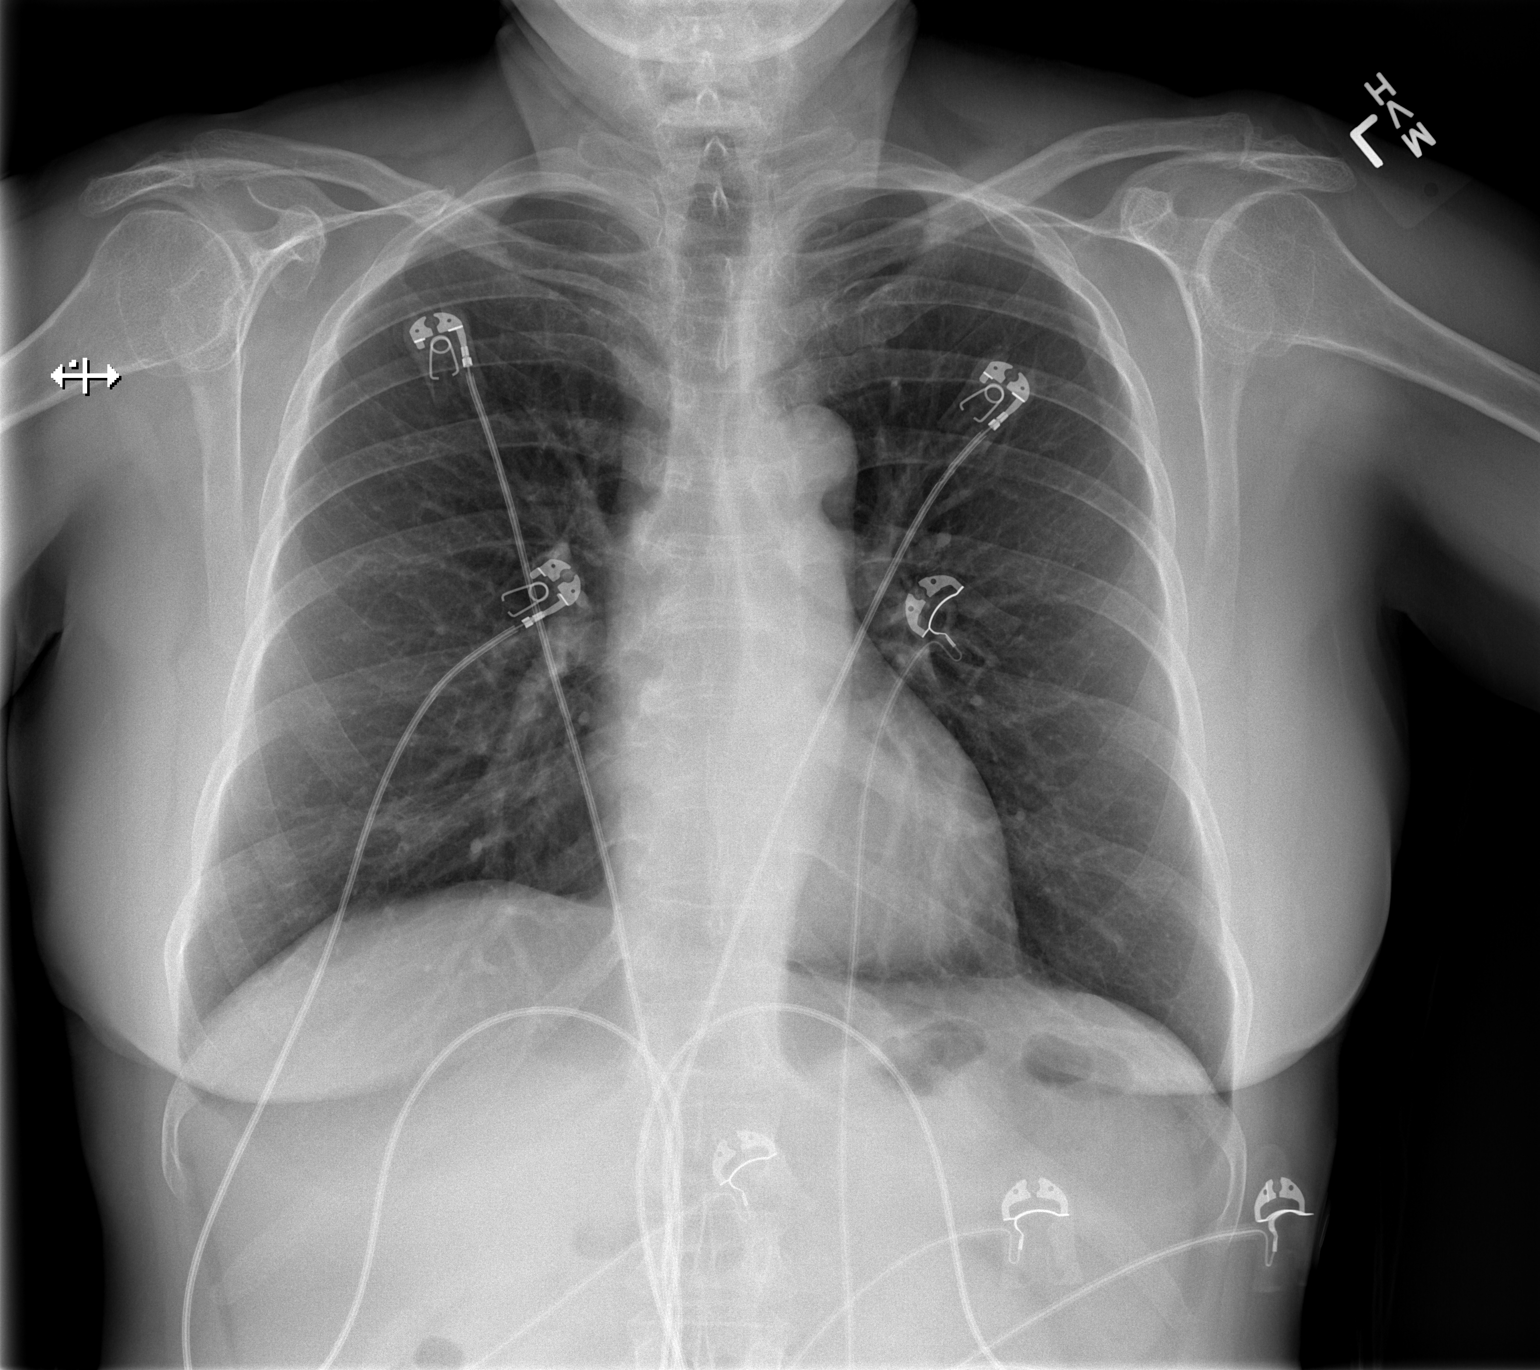

[w chest lat]
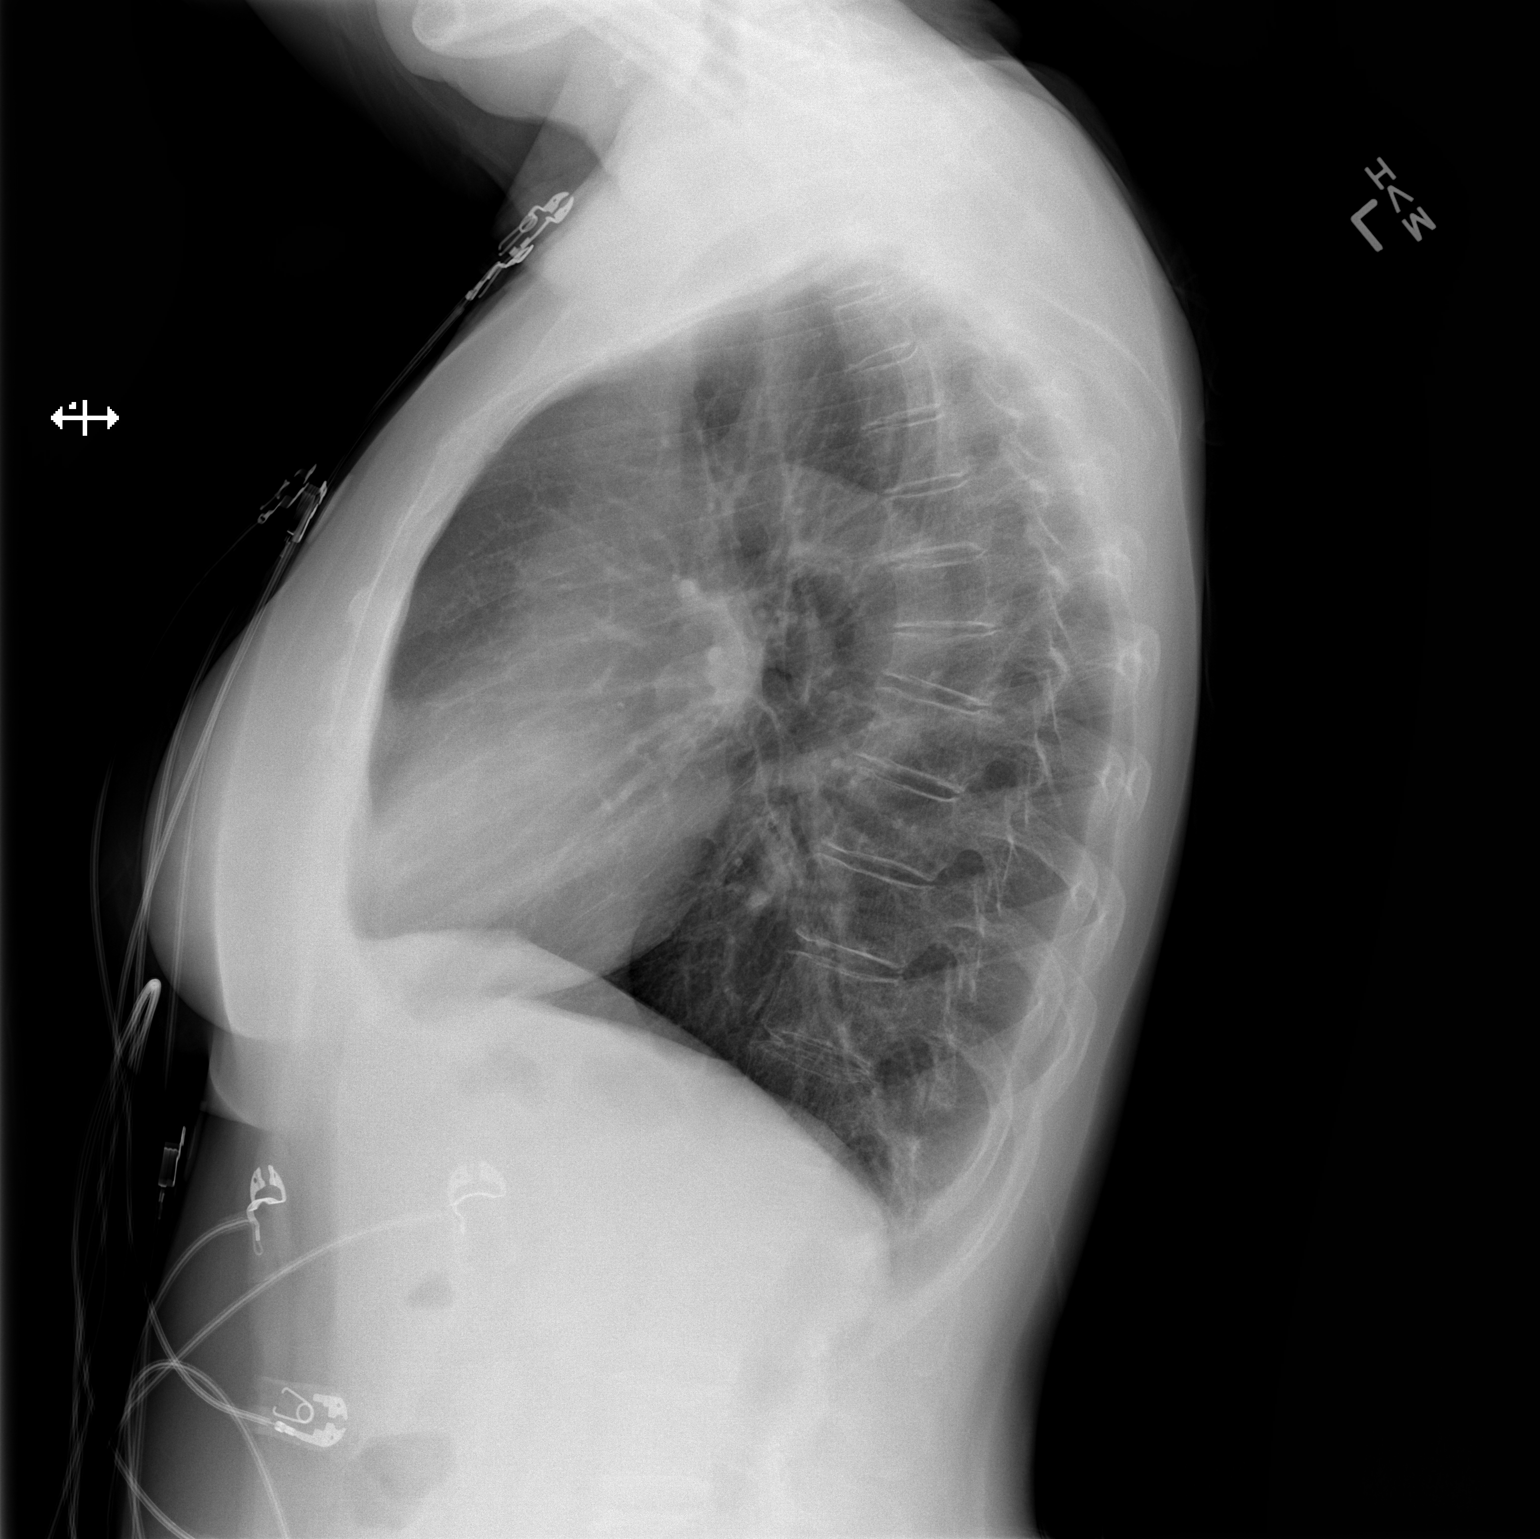

[2 of 2 positions shown; findings below may reference images not displayed]

FINDINGS: The heart size and mediastinal contours are within normal limits.

No focal consolidation. No pulmonary edema. No pleural effusion. No
pneumothorax.

No acute osseous abnormality. Chronic, possibly slightly worsened,
anterior wedge deformity of the T9 vertebral body.
IMPRESSION: 1. No active cardiopulmonary disease.
2. Chronic, possibly slightly worsened, anterior wedge deformity of
the T9 vertebral body. Recommend correlation with tenderness to
palpation to evaluate for an underlying acute component.

## 2022-09-27 ENCOUNTER — Encounter: Payer: Self-pay | Admitting: Podiatry

## 2022-09-29 NOTE — Progress Notes (Signed)
Subjective:  Patient ID: Penny Nash, female    DOB: [birth year],  MRN: [id number]  Chief Complaint  Patient presents with   Bunions    NP- R foot bunion. Sx consult   Callouses    Between 4 th ,5th toes    52 y.o. female presents with the above complaint. History confirmed with patient.  She has a history of prior surgical intervention that attempted to lengthen a short bone in her right foot.  She says that it grew back somewhat corrected, she has pain in the majority of the toes especially between the fourth and fifth, the second hammertoe and along the inside of the great toenail.  Objective:  Physical Exam: warm, good capillary refill, no trophic changes or ulcerative lesions, normal DP and PT pulses, normal sensory exam, and right foot has slight hallux valgus deformity, large hallux interphalangeus with prominent pinch tyloma on the medial hallux, she has a semirigid hammertoe of the second toe, digital contractures and inner digital heloma molle on the fourth interspace     Radiographs: Multiple views x-ray of the right foot: Hallux interphalangeus deformity noted, she has digital contractures and previous brachymetatarsia correction Assessment:   1. Bunion of right foot   2. Age-related osteoporosis without current pathological fracture   3. 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 Hallux valgus with bunions, right   5. Hammertoe of right foot      Plan:  Patient was evaluated and treated and all questions answered.  Reviewed her x-rays in detail.  We discussed her previous surgery and how this is led to malunion of the bone after the osteotomy, I discussed with her that these callus traction lengthenings can be quite difficult to achieve both length and exact alignment.  Her primary issue now is a painful interdigital corn between the fourth and fifth toes, hammertoe of the second toe, the remaining toes are contracted and cause pressure in the submetatarsal area as  well as hallux interphalangeus deformity.  She is exhausted all nonsurgical treatment occluding wider shoes which are difficult to find padding and anti-inflammatories.  I discussed with her that I think individual reconstructions would be quite difficult and that likely the best chance at forefoot reconstruction would be pan metatarsal resection and first MPJ fusion.  We discussed the rationale for the procedures.  We also discussed the possibility of needing syndactyly of the interdigital space as well as medial exostectomy of the IPJ of the hallux.  We discussed the risk benefits and potential complications including not limited to  pain, swelling, infection, scar, numbness which may be temporary or permanent, chronic pain, stiffness, nerve pain or damage, wound healing problems, bone healing problems including delayed or non-union.  All questions addressed.  Informed consent signed and reviewed.  Order placed for knee scooter to be nonweightbearing after surgery, she also have her vitamin D and calcium level checked due to her osteoporosis she is on Fosamax currently.   Surgical plan:  Procedure: -Right foot first MPJ fusion, medial hallux exostectomy, pan metatarsal head resection, hammertoe correction 2, 3, 4, 5 with possible syndactyly of the fourth and fifth toes  Location: -[facility]  Anesthesia plan: -IV sedation with regional block  Postoperative pain plan: - Tylenol 1000 mg every 6 hours,  gabapentin 300 mg every 8 hours x5 days, oxycodone 5 mg 1-2 tabs every 6 hours only as needed  DVT prophylaxis: -None required, she will hold her aspirin and Plavix 1 week prior to surgery and  resume immediately after  WB Restrictions / DME needs: -Nonweightbearing with knee scooter and splint postop    Return for after surgery.

## 2022-10-07 ENCOUNTER — Encounter: Payer: Self-pay | Admitting: Podiatry

## 2022-10-07 DIAGNOSIS — M79676 Pain in unspecified toe(s): Secondary | ICD-10-CM

## 2022-10-15 ENCOUNTER — Encounter: Payer: Self-pay | Admitting: Interventional Cardiology

## 2022-11-06 ENCOUNTER — Encounter: Payer: Self-pay | Admitting: Podiatry

## 2022-11-11 ENCOUNTER — Telehealth: Payer: Self-pay

## 2022-11-11 ENCOUNTER — Encounter: Payer: Self-pay | Admitting: Podiatry

## 2022-11-11 NOTE — Telephone Encounter (Signed)
Name: Penny Nash  DOB: 1970/11/04  MRN: [id number]  Primary Cardiologist: Lance Muss, MD   Preoperative team, please contact this patient and set up a phone call appointment for further preoperative risk assessment. Please obtain consent and complete medication review. Thank you for your help.  I confirm that guidance regarding antiplatelet and oral anticoagulation therapy has been completed and, if necessary, noted below.  Per office protocol, he may hold Plavix for 5 days prior to procedure and should resume as soon as hemodynamically stable postoperatively. Ideally aspirin should be continued without interruption, however if the bleeding risk is too great, aspirin may be held for 5-7 days prior to surgery. Please resume aspirin post operatively when it is felt to be safe from a bleeding standpoint.     Carlos Levering, NP 11/11/2022, 4:56 PM [facility]

## 2022-11-11 NOTE — Telephone Encounter (Signed)
Pre-operative Risk Assessment    Patient Name: Penny Nash  DOB: Dec 26, 1970 MRN: [id number]   DATE OF LAST VISIT: 03/01/22 WITH DR. VARANASI  UPCOMING VISIT: NONE SCHEDULED   Request for Surgical Clearance    Procedure:  Hammertoe repair and hallux MPJ fusion   Date of Surgery:  Clearance TBD                                 Surgeon:  Dr. Sharl Ma  Surgeon's Group or Practice Name:  [facility]  Phone number:  [phone] Fax number:  [phone]   Type of Clearance Requested:   - Medical    Type of Anesthesia:  General    Additional requests/questions: REQUESTING OFFICE WANTS TO KNOW IF ANY MEDICATIONS NEED TO BE HELD PRIOR TO SURGERY   Signed, Vernard Gambles   11/11/2022, 4:02 PM

## 2022-11-12 ENCOUNTER — Telehealth: Payer: Self-pay

## 2022-11-12 NOTE — Telephone Encounter (Signed)
  Patient Consent for Virtual Visit        Penny Nash has provided verbal consent on 11/12/2022 for a virtual visit (video or telephone).   CONSENT FOR VIRTUAL VISIT FOR:  Penny Nash  By participating in this virtual visit I agree to the following:  I hereby voluntarily request, consent and authorize [facility] and its employed or contracted physicians, physician assistants, nurse practitioners or other licensed health care professionals (the Practitioner), to provide me with telemedicine health care services (the "Services") as deemed necessary by the treating Practitioner. I acknowledge and consent to receive the Services by the Practitioner via telemedicine. I understand that the telemedicine visit will involve communicating with the Practitioner through live audiovisual communication technology and the disclosure of certain medical information by electronic transmission. I acknowledge that I have been given the opportunity to request an in-person assessment or other available alternative prior to the telemedicine visit and am voluntarily participating in the telemedicine visit.  I understand that I have the right to withhold or withdraw my consent to the use of telemedicine in the course of my care at any time, without affecting my right to future care or treatment, and that the Practitioner or I may terminate the telemedicine visit at any time. I understand that I have the right to inspect all information obtained and/or recorded in the course of the telemedicine visit and may receive copies of available information for a reasonable fee.  I understand that some of the potential risks of receiving the Services via telemedicine include:  Delay or interruption in medical evaluation due to technological equipment failure or disruption; Information transmitted may not be sufficient (e.g. poor resolution of images) to allow for appropriate medical decision making by the  Practitioner; and/or  In rare instances, security protocols could fail, causing a breach of personal health information.  Furthermore, I acknowledge that it is my responsibility to provide information about my medical history, conditions and care that is complete and accurate to the best of my ability. I acknowledge that Practitioner's advice, recommendations, and/or decision may be based on factors not within their control, such as incomplete or inaccurate data provided by me or distortions of diagnostic images or specimens that may result from electronic transmissions. I understand that the practice of medicine is not an exact science and that Practitioner makes no warranties or guarantees regarding treatment outcomes. I acknowledge that a copy of this consent can be made available to me via my patient portal [facility] MyChart), or I can request a printed copy by calling the office of  [facility].    I understand that my insurance will be billed for this visit.   I have read or had this consent read to me. I understand the contents of this consent, which adequately explains the benefits and risks of the Services being provided via telemedicine.  I have been provided ample opportunity to ask questions regarding this consent and the Services and have had my questions answered to my satisfaction. I give my informed consent for the services to be provided through the use of telemedicine in my medical care

## 2022-11-12 NOTE — Telephone Encounter (Signed)
Spoke with patient who is agreeable to do a tele visit for pre-op clearance on 12/02/22. Patient states that her surgery is scheduled on 12/13/22. Med rec and consent have been done.

## 2022-11-14 ENCOUNTER — Telehealth: Payer: Self-pay | Admitting: Podiatry

## 2022-11-14 NOTE — Telephone Encounter (Addendum)
DOS-01/15/2023 (updated surgery date)  MET. HEAD RES 5TH [id number] OSTECT. COMP. MET HEAD 2-4 RT-28112 HAMMERTOE REPAIR 2-5-28285 EXOSTECTOMY RT-28108 HALLUX MPJ FUSION [id number] BONE GRAFT RT-20900  [employer] EFFECTIVE DATE- 02/18/2022  DEDUCTIBLE- $1250.00 WITH REMAINING $1250.00 OOP- $4890.00 WITH REMAINING- $4473.12 COINSURANCE- 20%  SPOKE WITH AMANDA H. FROM [employer] AND SHE STATED THAT PRIOR AUTHORIZATION IS NOT REQUIRED FOR CPT CODES 69629,52841,32440,10272,53664 AND 40347.  CALL REFERENCE NUMBER: 11/14/2022 AMANDA H.

## 2022-11-18 NOTE — Progress Notes (Signed)
Pt has a televisit scheduled for pre op clearance 12/02/22. Once pt has been cleared our office will be sure to fax clearance notes.   Thank you  Danielle Rankin, CMA AAMA

## 2022-12-02 ENCOUNTER — Ambulatory Visit: Payer: BC Managed Care – PPO | Attending: Nurse Practitioner | Admitting: Nurse Practitioner

## 2022-12-02 ENCOUNTER — Encounter: Payer: Self-pay | Admitting: Nurse Practitioner

## 2022-12-02 DIAGNOSIS — Z01810 Encounter for preprocedural cardiovascular examination: Secondary | ICD-10-CM

## 2022-12-02 NOTE — Progress Notes (Signed)
Virtual Visit via Telephone Note   Because of Penny Nash's co-morbid illnesses, she is at least at moderate risk for complications without adequate follow up.  This format is felt to be most appropriate for this patient at this time.  The patient did not have access to video technology/had technical difficulties with video requiring transitioning to audio format only (telephone).  All issues noted in this document were discussed and addressed.  No physical exam could be performed with this format.  Please refer to the patient's chart for her consent to telehealth for [facility].  Evaluation Performed:  Preoperative cardiovascular risk assessment _____________   Date:  12/02/2022   Patient ID:  Penny Nash, DOB [birth year], MRN [id number] Patient Location:  Home Provider location:   Office  Primary Care Provider:  Macy Mis, MD Primary Cardiologist:  Lance Muss, MD  Chief Complaint / Patient Profile   52 y.o. y/o female with a h/o Turner syndrome, ICM, CAD with prior anterior MI 01/2020 with 100% stenosis mid LAD treated with IVUS PTCA and DES chronic systolic heart failure with EF 40-45% 01/2020 s/p MI, elevated LFT, fatty liver,  hyperlipidemia who is pending hammertoe repair and hallux MPJ fusion and presents today for telephonic preoperative cardiovascular risk assessment.  History of Present Illness    Penny Nash is a 52 y.o. female who presents via audio/video conferencing for a telehealth visit today.  Pt was last seen in cardiology clinic on 03/01/2022 by Dr. Eldridge Dace.  At that time Penny Nash was doing well.  The patient is now pending procedure as outlined above. Since her last visit, she denies chest pain, shortness of breath, lower extremity edema, fatigue, palpitations, melena, hematuria, hemoptysis, diaphoresis, weakness, presyncope, syncope, orthopnea, and PND. She remains active and achieve > 9000 steps daily with no  concerning cardiac symptoms.    Past Medical History    Past Medical History:  Diagnosis Date   Hip fracture (HCC) 2002   Low bone density    Scarlet fever    Turner syndrome    Past Surgical History:  Procedure Laterality Date   BREAST REDUCTION SURGERY Bilateral 2001 est   CORONARY ULTRASOUND/IVUS N/A 02/11/2020   Procedure: Intravascular Ultrasound/IVUS;  Surgeon: Corky Crafts, MD;  Location: [facility] INVASIVE CV LAB;  Service: Cardiovascular;  Laterality: N/A;   CORONARY/GRAFT ACUTE MI REVASCULARIZATION N/A 02/11/2020   Procedure: Coronary/Graft Acute MI Revascularization;  Surgeon: Corky Crafts, MD;  Location: [facility] INVASIVE CV LAB;  Service: Cardiovascular;  Laterality: N/A;   HIP FRACTURE SURGERY Left 2003 est   LEFT HEART CATH AND CORONARY ANGIOGRAPHY N/A 02/11/2020   Procedure: LEFT HEART CATH AND CORONARY ANGIOGRAPHY;  Surgeon: Corky Crafts, MD;  Location: [facility] INVASIVE CV LAB;  Service: Cardiovascular;  Laterality: N/A;    Allergies  No Known Allergies  Home Medications    Prior to Admission medications   Medication Sig Start Date End Date Taking? Authorizing Provider  aspirin EC 81 MG tablet Take 81 mg by mouth daily. Swallow whole.    [provider]  atorvastatin (LIPITOR) 80 MG tablet TAKE 1 TABLET BY MOUTH EVERY DAY 04/08/22   Corky Crafts, MD  Calcium-Vitamin D-Vitamin K (CHEWABLE CALCIUM PO) Take 2 tablets by mouth in the morning and at bedtime.    [provider]  clopidogrel (PLAVIX) 75 MG tablet TAKE 1 TABLET BY MOUTH EVERY DAY 04/08/22   Corky Crafts, MD  furosemide (LASIX) 20 MG tablet  Take 1 tablet (20 mg total) by mouth daily as needed for fluid (3lb weight gain). 05/22/20 03/01/22  Corky Crafts, MD  lisinopril (ZESTRIL) 2.5 MG tablet TAKE 1 TABLET BY MOUTH EVERY DAY 04/08/22   Corky Crafts, MD  metoprolol succinate (TOPROL-XL) 25 MG 24 hr tablet TAKE 1/2 TABLET BY MOUTH EVERY DAY 04/15/22    Corky Crafts, MD  nitroGLYCERIN (NITROSTAT) 0.4 MG SL tablet Place 1 tablet (0.4 mg total) under the tongue every 5 (five) minutes as needed for chest pain. 02/14/20   Marcelino Duster, PA    Physical Exam    Vital Signs:  Penny Nash does not have vital signs available for review today.  Given telephonic nature of communication, physical exam is limited. AAOx3. NAD. Normal affect.  Speech and respirations are unlabored.  Accessory Clinical Findings    None  Assessment & Plan    1.  Preoperative Cardiovascular Risk Assessment: According to the Revised Cardiac Risk Index (RCRI), her Perioperative Risk of Major Cardiac Event is (%): 6.6. Her Functional Capacity in METs is: 7.04 according to the Duke Activity Status Index (DASI). The patient is doing well from a cardiac perspective. Therefore, based on ACC/AHA guidelines, the patient would be at acceptable risk for the planned procedure without further cardiovascular testing. I will forward clearance to requesting provider.   The patient was advised that if she develops new symptoms prior to surgery to contact our office to arrange for a follow-up visit, and she verbalized understanding.  Per office protocol, she may hold Plavix for 5 days prior to procedure and should resume as soon as hemodynamically stable postoperatively. Ideally aspirin should be continued without interruption, however if the bleeding risk is too great, aspirin may be held for 5-7 days prior to surgery. Please resume aspirin post operatively when it is felt to be safe from a bleeding standpoint.   A copy of this note will be routed to requesting surgeon.  Time:   Today, I have spent 10 minutes with the patient with telehealth technology discussing medical history, symptoms, and management plan.    Levi Aland, NP-C  12/02/2022, 2:16 PM [address] Office [phone] Fax [phone]

## 2022-12-09 ENCOUNTER — Encounter: Payer: Self-pay | Admitting: Podiatry

## 2022-12-12 ENCOUNTER — Telehealth: Payer: Self-pay | Admitting: Interventional Cardiology

## 2022-12-12 NOTE — Telephone Encounter (Signed)
Patient surgery was cancel and will have to get r/s. Calling to see what she should do about the medication. Please advise

## 2022-12-12 NOTE — Telephone Encounter (Signed)
Spoke with the patient who states that her foot surgery was cancelled. She has been holding her Plavix and would like to know what to do. She does not currently have a new surgery date. I advised her to go ahead and restart her plavix.

## 2022-12-17 ENCOUNTER — Telehealth: Payer: Self-pay | Admitting: Cardiology

## 2022-12-17 NOTE — Telephone Encounter (Signed)
   Patient Name: Penny Nash  DOB: [birth year] MRN: [id number]  Primary Cardiologist: Lance Muss, MD  Chart reviewed as part of pre-operative protocol coverage. Given past medical history and time since last visit, based on ACC/AHA guidelines, Penny Nash is at acceptable risk for the planned procedure without further cardiovascular testing.   Per office protocol, she may hold Plavix for 5 days prior to procedure and should resume as soon as hemodynamically stable postoperatively. Ideally aspirin should be continued without interruption, however if the bleeding risk is too great, aspirin may be held for 5-7 days prior to surgery. Please resume aspirin post operatively when it is felt to be safe from a bleeding standpoint.    I will route this recommendation to the requesting party via Epic fax function and remove from pre-op pool.  Please call with questions.  Napoleon Form, Leodis Rains, NP 12/17/2022, 12:17 PM

## 2022-12-17 NOTE — Telephone Encounter (Signed)
   Pre-operative Risk Assessment    Patient Name: Penny Nash  DOB: 1970/10/28 MRN: [id number]      Request for Surgical Clearance    Procedure:   Metatarsal head resection, Ostectomy complete metatarsal head x 3 toes, Hammer toe repair x4 toes, Exostectomy Hallux MPJ fusion, bone graft   Date of Surgery:  Clearance 01/15/23                                 Surgeon:  Dr Sharl Ma Surgeon's Group or Practice Name:  [facility]  Phone number:  [phone]  Fax number:  [phone]   Type of Clearance Requested:   - Medical  - Pharmacy:  Hold    Defer to cards   Type of Anesthesia:   IV Sed and Block   Additional requests/questions:   They need new clearance due to procedure date being rescheduled.  Nilda Riggs   12/17/2022, 11:53 AM

## 2022-12-18 ENCOUNTER — Encounter: Payer: Self-pay | Admitting: Podiatry

## 2022-12-19 ENCOUNTER — Encounter: Payer: BC Managed Care – PPO | Admitting: Podiatry

## 2023-01-02 ENCOUNTER — Encounter: Payer: BC Managed Care – PPO | Admitting: Podiatry

## 2023-01-08 ENCOUNTER — Encounter
Admission: RE | Admit: 2023-01-08 | Discharge: 2023-01-08 | Disposition: A | Discharge status: Home / Self Care | Payer: BC Managed Care – PPO | Source: Ambulatory Visit | Attending: Podiatry | Admitting: Podiatry

## 2023-01-08 ENCOUNTER — Other Ambulatory Visit: Payer: Self-pay

## 2023-01-08 VITALS — Ht <= 58 in | Wt 112.0 lb

## 2023-01-08 DIAGNOSIS — I2109 ST elevation (STEMI) myocardial infarction involving other coronary artery of anterior wall: Secondary | ICD-10-CM

## 2023-01-08 HISTORY — DX: Atherosclerotic heart disease of native coronary artery without angina pectoris: I25.10

## 2023-01-08 HISTORY — DX: Other specified abnormal findings of blood chemistry: R79.89

## 2023-01-08 HISTORY — DX: Age-related osteoporosis without current pathological fracture: M81.0

## 2023-01-08 HISTORY — DX: Other hammer toe(s) (acquired), right foot: M20.41

## 2023-01-08 HISTORY — DX: Other intraoperative and postprocedural complications and disorders of the musculoskeletal system: M96.89

## 2023-01-08 HISTORY — DX: Bunion of unspecified foot: M21.619

## 2023-01-08 HISTORY — DX: Hyperlipidemia, unspecified: E78.5

## 2023-01-08 HISTORY — DX: Hallux valgus (acquired), right foot: M20.11

## 2023-01-08 NOTE — Patient Instructions (Addendum)
Your procedure is scheduled on: Wednesday 01/15/2023 Report to the Registration Desk on the 1st floor of [facility]. To find out your arrival time, please call [phone] between 1PM - 3PM on: Tuesday 01/14/2023  If your arrival time is 6:00 am, do not arrive before that time as the [facility] entrance doors do not open until 6:00 am.  REMEMBER: Instructions that are not followed completely may result in serious medical risk, up to and including death; or upon the discretion of your surgeon and anesthesiologist your surgery may need to be rescheduled.  Do not eat food after midnight the night before surgery.  No gum chewing or hard candies.  You may however, drink CLEAR liquids up to 2 hours before you are scheduled to arrive for your surgery. Do not drink anything within 2 hours of your scheduled arrival time.  Clear liquids include: - water  - apple juice without pulp - black coffee or tea (Do NOT add milk or creamers to the coffee or tea) Do NOT drink anything that is not on this list.   In addition, your doctor has ordered for you to drink the provided:  Ensure Pre-Surgery Clear Carbohydrate Drink  Drinking this carbohydrate drink up to two hours before surgery helps to reduce insulin resistance and improve patient outcomes. Please complete drinking 2 hours before scheduled arrival time.  One week prior to surgery: Stop Anti-inflammatories (NSAIDS) such as Advil, Aleve, Ibuprofen, Motrin, Naproxen, Naprosyn and Aspirin based products such as Excedrin, Goody's Powder, BC Powder. Stop ANY OVER THE COUNTER supplements until after surgery.  You may however, continue to take Tylenol if needed for pain up until the day of surgery. Stop clopidogrel (PLAVIX) 75 MG 5 days prior to surgery (take last dose 01/09/2023)  Continue taking all of your other prescription medications up until the day of surgery.  ON THE DAY OF SURGERY ONLY TAKE THESE MEDICATIONS WITH SIPS OF  WATER:  metoprolol succinate (TOPROL-XL) 25 MG  atorvastatin (LIPITOR) 80 MG    No Alcohol for 24 hours before or after surgery.  No Smoking including e-cigarettes for 24 hours before surgery.  No chewable tobacco products for at least 6 hours before surgery.  No nicotine patches on the day of surgery.  Do not use any "recreational" drugs for at least a week (preferably 2 weeks) before your surgery.  Please be advised that the combination of cocaine and anesthesia may have negative outcomes, up to and including death. If you test positive for cocaine, your surgery will be cancelled.  On the morning of surgery brush your teeth with toothpaste and water, you may rinse your mouth with mouthwash if you wish. Do not swallow any toothpaste or mouthwash.  Use CHG Soap or wipes as directed on instruction sheet.  Do not wear jewelry, make-up, hairpins, clips or nail polish.  For welded (permanent) jewelry: bracelets, anklets, waist bands, etc.  Please have this removed prior to surgery.  If it is not removed, there is a chance that hospital personnel will need to cut it off on the day of surgery.  Do not wear lotions, powders, or perfumes.   Do not shave body hair from the neck down 48 hours before surgery.  Contact lenses, hearing aids and dentures may not be worn into surgery.  Do not bring valuables to the hospital. [facility] is not responsible for any missing/lost belongings or valuables.   Notify your doctor if there is any change in your medical condition (  cold, fever, infection).  Wear comfortable clothing (specific to your surgery type) to the hospital.  After surgery, you can help prevent lung complications by doing breathing exercises.  Take deep breaths and cough every 1-2 hours. Your doctor may order a device called an Incentive Spirometer to help you take deep breaths. When coughing or sneezing, hold a pillow firmly against your incision with both hands. This is called  "splinting." Doing this helps protect your incision. It also decreases belly discomfort.  If you are being admitted to the hospital overnight, leave your suitcase in the car. After surgery it may be brought to your room.  In case of increased patient census, it may be necessary for you, the patient, to continue your postoperative care in the Same Day Surgery department.  If you are being discharged the day of surgery, you will not be allowed to drive home. You will need a responsible individual to drive you home and stay with you for 24 hours after surgery.   If you are taking public transportation, you will need to have a responsible individual with you.  Please call the Pre-admissions Testing Dept. at [phone] if you have any questions about these instructions.  Surgery Visitation Policy:  Patients having surgery or a procedure may have two visitors.  Children under the age of 52 must have an adult with them who is not the patient.  Inpatient Visitation:    Visiting hours are 7 a.m. to 8 p.m. Up to four visitors are allowed at one time in a patient room. The visitors may rotate out with other people during the day.  One visitor age 54 or older may stay with the patient overnight and must be in the room by 8 p.m.     Preparing for Surgery with CHLORHEXIDINE GLUCONATE (CHG) Soap  Chlorhexidine Gluconate (CHG) Soap  o An antiseptic cleaner that kills germs and bonds with the skin to continue killing germs even after washing  o Used for showering the night before surgery and morning of surgery  Before surgery, you can play an important role by reducing the number of germs on your skin.  CHG (Chlorhexidine gluconate) soap is an antiseptic cleanser which kills germs and bonds with the skin to continue killing germs even after washing.  Please do not use if you have an allergy to CHG or antibacterial soaps. If your skin becomes reddened/irritated stop using the CHG.  1.  Shower the NIGHT BEFORE SURGERY and the MORNING OF SURGERY with CHG soap.  2. If you choose to wash your hair, wash your hair first as usual with your normal shampoo.  3. After shampooing, rinse your hair and body thoroughly to remove the shampoo.  4. Use CHG as you would any other liquid soap. You can apply CHG directly to the skin and wash gently with a scrungie or a clean washcloth.  5. Apply the CHG soap to your body only from the neck down. Do not use on open wounds or open sores. Avoid contact with your eyes, ears, mouth, and genitals (private parts). Wash face and genitals (private parts) with your normal soap.  6. Wash thoroughly, paying special attention to the area where your surgery will be performed.  7. Thoroughly rinse your body with warm water.  8. Do not shower/wash with your normal soap after using and rinsing off the CHG soap.  9. Pat yourself dry with a clean towel.  10. Wear clean pajamas to bed the night before surgery.  12. Place clean sheets on your bed the night of your first shower and do not sleep with pets.  13. Shower again with the CHG soap on the day of surgery prior to arriving at the hospital.  14. Do not apply any deodorants/lotions/powders.  15. Please wear clean clothes to the hospital.  How to Use an Incentive Spirometer  An incentive spirometer is a tool that measures how well you are filling your lungs with each breath. Learning to take long, deep breaths using this tool can help you keep your lungs clear and active. This may help to reverse or lessen your chance of developing breathing (pulmonary) problems, especially infection. You may be asked to use a spirometer: After a surgery. If you have a lung problem or a history of smoking. After a long period of time when you have been unable to move or be active. If the spirometer includes an indicator to show the highest number that you have reached, your health care provider or respiratory  therapist will help you set a goal. Keep a log of your progress as told by your health care provider. What are the risks? Breathing too quickly may cause dizziness or cause you to pass out. Take your time so you do not get dizzy or light-headed. If you are in pain, you may need to take pain medicine before doing incentive spirometry. It is harder to take a deep breath if you are having pain. How to use your incentive spirometer  Sit up on the edge of your bed or on a chair. Hold the incentive spirometer so that it is in an upright position. Before you use the spirometer, breathe out normally. Place the mouthpiece in your mouth. Make sure your lips are closed tightly around it. Breathe in slowly and as deeply as you can through your mouth, causing the piston or the ball to rise toward the top of the chamber. Hold your breath for 3-5 seconds, or for as long as possible. If the spirometer includes a coach indicator, use this to guide you in breathing. Slow down your breathing if the indicator goes above the marked areas. Remove the mouthpiece from your mouth and breathe out normally. The piston or ball will return to the bottom of the chamber. Rest for a few seconds, then repeat the steps 10 or more times. Take your time and take a few normal breaths between deep breaths so that you do not get dizzy or light-headed. Do this every 1-2 hours when you are awake. If the spirometer includes a goal marker to show the highest number you have reached (best effort), use this as a goal to work toward during each repetition. After each set of 10 deep breaths, cough a few times. This will help to make sure that your lungs are clear. If you have an incision on your chest or abdomen from surgery, place a pillow or a rolled-up towel firmly against the incision when you cough. This can help to reduce pain while taking deep breaths and coughing. General tips When you are able to get out of bed: Walk around  often. Continue to take deep breaths and cough in order to clear your lungs. Keep using the incentive spirometer until your health care provider says it is okay to stop using it. If you have been in the hospital, you may be told to keep using the spirometer at home. Contact a health care provider if: You are having difficulty using the spirometer. You have  trouble using the spirometer as often as instructed. Your pain medicine is not giving enough relief for you to use the spirometer as told. You have a fever. Get help right away if: You develop shortness of breath. You develop a cough with bloody mucus from the lungs. You have fluid or blood coming from an incision site after you cough. Summary An incentive spirometer is a tool that can help you learn to take long, deep breaths to keep your lungs clear and active. You may be asked to use a spirometer after a surgery, if you have a lung problem or a history of smoking, or if you have been inactive for a long period of time. Use your incentive spirometer as instructed every 1-2 hours while you are awake. If you have an incision on your chest or abdomen, place a pillow or a rolled-up towel firmly against your incision when you cough. This will help to reduce pain. Get help right away if you have shortness of breath, you cough up bloody mucus, or blood comes from your incision when you cough. This information is not intended to replace advice given to you by your health care provider. Make sure you discuss any questions you have with your health care provider. Document Revised: 04/26/2019 Document Reviewed: 04/26/2019 Elsevier Patient Education  2023 [employer].

## 2023-01-10 ENCOUNTER — Encounter
Admission: RE | Admit: 2023-01-10 | Discharge: 2023-01-10 | Disposition: A | Discharge status: Home / Self Care | Payer: BC Managed Care – PPO | Source: Ambulatory Visit | Attending: Podiatry | Admitting: Podiatry

## 2023-01-10 DIAGNOSIS — I2109 ST elevation (STEMI) myocardial infarction involving other coronary artery of anterior wall: Secondary | ICD-10-CM | POA: Diagnosis not present

## 2023-01-10 DIAGNOSIS — Z01810 Encounter for preprocedural cardiovascular examination: Secondary | ICD-10-CM | POA: Insufficient documentation

## 2023-01-13 ENCOUNTER — Encounter: Payer: Self-pay | Admitting: Podiatry

## 2023-01-13 NOTE — H&P (View-Only) (Signed)
Perioperative / Anesthesia Services  Pre-Admission Testing Clinical Review / Pre-Operative Anesthesia Consult  Date: 01/13/23  Patient Demographics:  Name: Penny Nash DOB:   1971/02/16 MRN:   [id number]  Planned Surgical Procedure(s):    Case: [id number] Date/Time: 01/15/23 0715   Procedures:      METATARSAL HEAD RESECTION 5TH TOE RIGHT FOOT (Right: Toe)     OSTECTOMY COMPLETE MET HEAD RESECTION 3 TOES RIGHT FOOT; EXOSTECTOMY 1 TOE RIGHT FOOT (Right)     HAMMER TOE CORRECTION 4 TOES RIGHT FOOT (Right: Toe)     HALLUX MPJ FUSION (Right)     BONE GRAFT (Right: Foot)   Anesthesia type: General   Pre-op diagnosis: 5 HAMMER TOE RIGHT FOOT   Location: [facility] OR ROOM 02 / [facility]   Surgeons: Edwin Cap, DPM     NOTE: Available PAT nursing documentation and vital signs have been reviewed. Clinical nursing staff has updated patient's PMH/PSHx, current medication list, and drug allergies/intolerances to ensure comprehensive history available to assist in medical decision making as it pertains to the aforementioned surgical procedure and anticipated anesthetic course. Extensive review of available clinical information personally performed. Penny Nash PMH and PSHx updated with any diagnoses/procedures that  may have been inadvertently omitted during her intake with the pre-admission testing department's nursing staff.  Clinical Discussion:  Penny Nash is a 52 y.o. female who is submitted for pre-surgical anesthesia review and clearance prior to her undergoing the above procedure. Patient has never been a smoker. Pertinent PMH includes: CAD, anterior STEMI, ischemic cardiomyopathy, chronic systolic heart failure, HTN, HLD, prediabetes, remote Scarlet fever, Turner Syndrome, osteoporosis, RIGHT hallux valgus with bunion, RIGHT hammertoe, hepatic steatosis with resulting transaminitis.  Patient is followed by cardiology Eldridge Dace, MD). She was last seen in the  cardiology clinic on 03/01/2022; notes reviewed. At the time of her clinic visit, patient doing well overall from a cardiovascular perspective. Patient denied any chest pain, shortness of breath, PND, orthopnea, palpitations, significant peripheral edema, weakness, fatigue, vertiginous symptoms, or presyncope/syncope. Patient with a past medical history significant for cardiovascular diagnoses. Documented physical exam was grossly benign, providing no evidence of acute exacerbation and/or decompensation of the patient's known cardiovascular conditions.  Patient suffered an anterior wall STEMI on 02/11/2020.  Diagnostic LEFT heart catheterization was performed revealing 75% stenosis of the proximal LAD, 100% stenosis of the mid LAD, and 50% stenosis of the mid to distal LAD.  PCI was performed placing a 3.5 x 12 mm Synergy DES x 1 to the proximal LAD.  Mid LAD felt to be the culprit lesion. Patient required aspiration thrombectomy + IVUS + PTCA + placement of a 2.25 x 24 mm Synergy DES to the mid LAD lesion.  Interventions yielded excellent angiographic result and TIMI-3 flow.  Most recent TTE was performed on 02/12/2020 revealing a mildly reduced left ventricular systolic function with an EF of 40-45%.  There was severe hypokinesis of the mid anterior wall/septum into the apex and around the apical septum consistent with LAD infarction.  Right ventricular size and function normal.  Diastolic Doppler parameters were normal.  There was trivial mitral valve regurgitation.  All transvalvular gradients were noted to be normal providing no evidence suggestive of valvular stenosis.  Of note, patient with a history of Turner syndrome.  Aortic valve was confirmed to be tricuspid on exam.  Aorta was normal in size with no evidence of aneurysmal dilatation.  Following stent placement, patient remains on daily DAPT therapy using  ASA + clopidogrel.  Patient reported to be compliant with therapy with no evidence or reports  of GI/GU related bleeding. Blood pressure well controlled at 96/62 mmHg on currently prescribed beta-blocker (metoprolol succinate) and ACEi (lisinopril) therapies.  Patient is on atorvastatin for her HLD diagnosis and ASCVD prevention.  Patient has a supply of short acting nitrates (NTG) to use on a as needed basis for recurrent angina/anginal equivalent symptoms; denied recent use.  Patient has a prediabetes diagnosis; last HgbA1c demonstrated good glycemic control as evidenced by a value of 5.6% on 10/28/2022. Patient is able to complete all of her  ADL/IADLs without cardiovascular limitation.  Per the DASI, patient is able to achieve at least 4 METS of physical activity without experiencing any significant degree of angina/anginal equivalent symptoms.  No changes were made to her medication regimen.  Patient to follow-up with outpatient cardiology in 1 year or sooner if needed.  Penny Nash is scheduled for an METATARSAL HEAD RESECTION 5TH TOE RIGHT FOOT (Right: Toe); OSTECTOMY COMPLETE MET HEAD RESECTION 3 TOES RIGHT FOOT; EXOSTECTOMY 1 TOE RIGHT FOOT (Right); HAMMER TOE CORRECTION 4 TOES RIGHT FOOT (Right: Toe); HALLUX MPJ FUSION (Right); BONE GRAFT (Right: Foot) on 01/15/2023 with Dr. Sharl Ma, DPM.  Given patient's past medical history significant for cardiovascular diagnoses, presurgical cardiac clearance was sought by the PAT team. Additionally, office requested cleared from patient' s PCP. Clearances were obtained as follows:  Per family medicine Earnest Bailey, MD), "no medical contraindication. See cardiology recommendations. Patient may proceed as planned".   Per cardiology, "based ACC/AHA guidelines, the patient's past medical history, and the amount of time since her last clinic visit, this patient would be at an overall ACCEPTABLE risk for the planned procedure without further cardiovascular testing or intervention at this time".   Again, this patient is on daily DAPT therapy. She has  been instructed on recommendations for holding her clopidogrel for 5 days prior to her procedure with plans to restart as soon as postoperative bleeding risk felt to be minimized by her attending surgeon. The patient has been instructed that her last dose of her clopidogrel should be on 01/09/2023. Given her cardiovascular history, the patient will continue her daily low dose ASA throughout her perioperative course.   Patient denies previous perioperative complications with anesthesia in the past. In review of the EMR, there are no records available for review regarding any recent past surgical/anesthetic courses within the [facility] system.     01/08/2023   12:52 PM 09/26/2022    2:39 PM 03/01/2022    8:49 AM  Vitals with BMI  Height 4\' 9"   4\' 10"   Weight 112 lbs  109 lbs  BMI 24.23  22.79  Systolic  113 96  Diastolic  68 62  Pulse   58    Providers/Specialists:   NOTE: Primary physician provider listed below. Patient may have been seen by APP or partner within same practice.   PROVIDER ROLE / SPECIALTY LAST Gwynneth Munson, DPM Podiatry (Surgeon)  09/26/2022  Macy Mis, MD Primary Care Provider  12/27/2022  Lance Muss, MD  Cardiology  03/01/2022; update call with preop APP on 12/17/2022   Allergies:  Patient has no known allergies.  Current Home Medications:   No current facility-administered medications for this encounter.    alendronate (FOSAMAX) 70 MG tablet   aspirin EC 81 MG tablet   atorvastatin (LIPITOR) 80 MG tablet   CALCIUM PO   clopidogrel (PLAVIX) 75 MG tablet  lisinopril (ZESTRIL) 2.5 MG tablet   metoprolol succinate (TOPROL-XL) 25 MG 24 hr tablet   nitroGLYCERIN (NITROSTAT) 0.4 MG SL tablet   tretinoin (RETIN-A) 0.05 % cream   VITAMIN D PO   History:   Past Medical History:  Diagnosis Date   Age-related osteoporosis without current pathological fracture    Bunion    Cholelithiases    Chronic systolic heart failure (HCC)    a.) TTE  02/12/2020: EF 40-45%, mid anterior/septal wall with sever HK, norm RVSF, triv MR   Coronary artery disease    a.) LHC/PCI 02/11/2020: 75% pLAD (3.5 x 12 mm Synergy DES), 100% mLAD (asp thrombectomy + IVUS + PTCA+ 2.25 x 24 Synergy DES), 50% m-dLAD   Elevated LFTs    Hallux valgus with bunions, right    Hammertoe of right foot    Hepatic steatosis    Hip fracture (HCC) 2002   HTN (hypertension)    Hyperlipidemia with target LDL less than 70    Ischemic cardiomyopathy 01/2020   a.) LHC 02/11/2020: EF 45-50%; b.) TTE 02/12/2020: EF 40-45%   Long term current use of aspirin    Low bone density    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    On chronic clopidogrel therapy    Osteoporosis    Prediabetes    Scarlet fever    ST elevation myocardial infarction (STEMI) of anterior wall (HCC) 02/11/2020   a.) LHC 02/11/2023: 75% pLAD (3.5 x 12 mm Synergy DES), 100% mLAD (culprit lesion --> asp thrombectomy + IVUS + PTCA + 2.25 x 24 mm Synergy DES), 50% m-dLAD   Turner syndrome    a.) confirmed tricuspid AoV 02/12/2020   Past Surgical History:  Procedure Laterality Date   BREAST REDUCTION SURGERY Bilateral 2001 est   CORONARY ULTRASOUND/IVUS N/A 02/11/2020   Procedure: Intravascular Ultrasound/IVUS;  Surgeon: Corky Crafts, MD;  Location: [facility] INVASIVE CV LAB;  Service: Cardiovascular;  Laterality: N/A;   CORONARY/GRAFT ACUTE MI REVASCULARIZATION N/A 02/11/2020   Procedure: Coronary/Graft Acute MI Revascularization;  Surgeon: Corky Crafts, MD;  Location: [facility] INVASIVE CV LAB;  Service: Cardiovascular;  Laterality: N/A;   HIP FRACTURE SURGERY Left 2003 est   LEFT HEART CATH AND CORONARY ANGIOGRAPHY N/A 02/11/2020   Procedure: LEFT HEART CATH AND CORONARY ANGIOGRAPHY;  Surgeon: Corky Crafts, MD;  Location: [facility] INVASIVE CV LAB;  Service: Cardiovascular;  Laterality: N/A;   Family History  Problem Relation Age of Onset   Breast cancer Mother    Mitral valve prolapse Sister    Social  History   Tobacco Use   Smoking status: Never   Smokeless tobacco: Never  Vaping Use   Vaping status: Never Used  Substance Use Topics   Alcohol use: Not Currently   Drug use: Not Currently    Pertinent Clinical Results:  LABS:   Component Ref Range & Units 10/28/2022  WBC 3.4 - 10.8 x10E3/uL 5.9  RBC 3.77 - 5.28 x10E6/uL 4.48  Hemoglobin 11.1 - 15.9 g/dL 64.4  Hematocrit 03.4 - 46.6 % 43.2  MCV 79 - 97 fL 96  MCH 26.6 - 33.0 pg 31.9  MCHC 31.5 - 35.7 g/dL 74.2  RDW 59.5 - 63.8 % 11.8  Platelet Count 150 - 450 x10E3/uL 201  Neutrophils Not Estab. % 69  Lymphs Relative Not Estab. % 21  Monocytes Not Estab. % 9  Eos Relative Not Estab. % 0  Basos Relative Not Estab. % 1  Neutrophils Absolute 1.4 - 7.0 x10E3/uL 4.1  Lymphocytes  Absolute 0.7 - 3.1 x10E3/uL 1.2  Monocytes Absolute 0.1 - 0.9 x10E3/uL 0.5  Eosinophils Absolute 0.0 - 0.4 x10E3/uL 0.0  Basophils Absolute 0.0 - 0.2 x10E3/uL 0.0  Immature Granulocytes Not Estab. % 0  Immature Grans (Abs) 0.0 - 0.1 x10E3/uL 0.0  Narrative Performed by [facility] Performed at:  [address] [facility] [address] [place], [place]  [id number] Lab Director: Jolene Schimke MD, Phone:  [phone] Specimen Collected: 10/28/22 13:35   Performed by: Verdell Carmine Last Resulted: 10/29/22 05:36  Received From: [facility]  Result Received: 12/09/22 11:30   Component Ref Range & Units 10/28/2022  Glucose 70 - 99 mg/dL 84  BUN 6 - 24 mg/dL 10  Creatinine 2.53 - 6.64 mg/dL 4.03  eGFR >47 QQ/VZD/6.38 91  BUN/Creatinine Ratio 9 - 23 13  Sodium 134 - 144 mmol/L 139  Potassium 3.5 - 5.2 mmol/L 4.0  Chloride 96 - 106 mmol/L 103  CO2 20 - 29 mmol/L 24  CALCIUM 8.7 - 10.2 mg/dL 9.1  Total Protein 6.0 - 8.5 g/dL 6.7  Albumin, Serum 3.8 - 4.9 g/dL 4.5  Globulin, Total 1.5 - 4.5 g/dL 2.2  Total Bilirubin 0.0 - 1.2 mg/dL 0.6  Alkaline Phosphatase 44 - 121 IU/L 121  AST 0 - 40 IU/L 44 High   ALT (SGPT) 0  - 32 IU/L 42 High   Narrative Performed by [facility] Performed at:  [address] [facility] [address], [place], [place]  [id number] Lab Director: Jolene Schimke MD, Phone:  [phone] Specimen Collected: 10/28/22 13:35   Performed by: Verdell Carmine Last Resulted: 10/29/22 05:36  Received From: [facility]  Result Received: 12/09/22 11:30    ECG: Date: 01/10/2023 Time ECG obtained: 1123 AM Rate: 56 bpm Rhythm: sinus bradycardia Axis (leads I and aVF): Right axis deviation Intervals: PR 140 ms. QRS 86 ms. QTc 411 ms. ST segment and T wave changes: No evidence of acute ST segment elevation or depression.  Evidence of a possible age undetermined anteroseptal infarct present. Comparison: Similar to previous tracing obtained on 03/01/2022   IMAGING / PROCEDURES: DG FOOT 2 VIEWS RIGHT performed on 09/26/2022 Hallux interphalangeus deformity noted, she has digital contractures and previous brachymetatarsia correction  . TRANSTHORACIC ECHOCARDIOGRAM performed on 02/12/2020 There is severe hypokinesis of the mid anterior wall/septum into the apex and around the apical septum consistent with LAD infarction. Left ventricular ejection fraction, by estimation, is 40 to 45%. Left  ventricular ejection fraction by 2D MOD biplane is 44.3 %. The left ventricle has mildly decreased function. The left ventricle demonstrates regional wall motion abnormalities (see scoring diagram/findings for description). Left ventricular diastolic parameters were normal.  Right ventricular systolic function is normal. The right ventricular size is normal. Tricuspid regurgitation signal is inadequate for assessing PA pressure.  The mitral valve is grossly normal. Trivial mitral valve regurgitation. No evidence of mitral stenosis.  The aortic valve is tricuspid. Aortic valve regurgitation is not visualized. No aortic stenosis is present.  The inferior vena cava is normal in size with greater than 50% respiratory variability,  suggesting right atrial pressure of 3 mmHg.   LEFT HEART CATHETERIZATION AND CORONARY ANGIOGRAPHY performed on 02/11/2020 Mildly reduced left regular systolic function with an EF of 45-50%. Normal LVEDP Apical hypokinesis likely related to late presentation of acute anterior MI Significant CAD 75% pLAD 100% mLAD 50% m-dLAD Successful PCI Aspiration thrombectomy + IVUS + PTCA + 2.25 x 24 mm Synergy DES to mLAD 3.5 x 12 mm Synergy DES to pLAD No aortic valve  stenosis Recommendations Continue aggressive secondary prevention.  Beta blocker and high dose statin started.  Watch in ICU.  Check echocardiogram for evidence of LV thrombus.  Due to her late presentation, I would not be surprised if she does have some LV dysfunction. She will need medical therapy to help improve LVEF.  No other targets for PCI as her mid to distal LAD is diffusely calcified and diseased.    Impression and Plan:  Penny Nash has been referred for pre-anesthesia review and clearance prior to her undergoing the planned anesthetic and procedural courses. Available labs, pertinent testing, and imaging results were personally reviewed by me in preparation for upcoming operative/procedural course. [facility] medical record has been updated following extensive record review and patient interview with PAT staff.   This patient has been appropriately cleared by cardiology (ACCEPTABLE) and by internal/family medicine (ACCEPTABLE) with the individually indicated risk of significant perioperative complications. Based on clinical review performed today (01/13/23), barring any significant acute changes in the patient's overall condition, it is anticipated that she will be able to proceed with the planned surgical intervention. Any acute changes in clinical condition may necessitate her procedure being postponed and/or cancelled. Patient will meet with anesthesia team (MD and/or CRNA) on the day of her procedure for  preoperative evaluation/assessment. Questions regarding anesthetic course will be fielded at that time.   Pre-surgical instructions were reviewed with the patient during her PAT appointment, and questions were fielded to satisfaction by PAT clinical staff. She has been instructed on which medications that she will need to hold prior to surgery, as well as the ones that have been deemed safe/appropriate to take on the day of her procedure. As part of the general education provided by PAT, patient made aware both verbally and in writing, that she would need to abstain from the use of any illegal substances during her perioperative course.  She was advised that failure to follow the provided instructions could necessitate case cancellation or result in serious perioperative complications up to and including death. Patient encouraged to contact PAT and/or her surgeon's office to discuss any questions or concerns that may arise prior to surgery; verbalized understanding.   Quentin Mulling, MSN, APRN, FNP-C, CEN [facility]  Perioperative Services Nurse Practitioner Phone: [phone] Fax: [phone] 01/13/23 2:51 PM  NOTE: This note has been prepared using Dragon dictation software. Despite my best ability to proofread, there is always the potential that unintentional transcriptional errors may still occur from this process.

## 2023-01-13 NOTE — Progress Notes (Signed)
Perioperative / Anesthesia Services  Pre-Admission Testing Clinical Review / Pre-Operative Anesthesia Consult  Date: 01/13/23  Patient Demographics:  Name: Penny Nash DOB:   [birth year] MRN:   [id number]  Planned Surgical Procedure(s):    Case: [id number] Date/Time: 01/15/23 0715   Procedures:      METATARSAL HEAD RESECTION 5TH TOE RIGHT FOOT (Right: Toe)     OSTECTOMY COMPLETE MET HEAD RESECTION 3 TOES RIGHT FOOT; EXOSTECTOMY 1 TOE RIGHT FOOT (Right)     HAMMER TOE CORRECTION 4 TOES RIGHT FOOT (Right: Toe)     HALLUX MPJ FUSION (Right)     BONE GRAFT (Right: Foot)   Anesthesia type: General   Pre-op diagnosis: 5 HAMMER TOE RIGHT FOOT   Location: [facility] OR ROOM 02 / [facility]   Surgeons: Edwin Cap, DPM     NOTE: Available PAT nursing documentation and vital signs have been reviewed. Clinical nursing staff has updated patient's PMH/PSHx, current medication list, and drug allergies/intolerances to ensure comprehensive history available to assist in medical decision making as it pertains to the aforementioned surgical procedure and anticipated anesthetic course. Extensive review of available clinical information personally performed. Veteran PMH and PSHx updated with any diagnoses/procedures that  may have been inadvertently omitted during her intake with the pre-admission testing department's nursing staff.  Clinical Discussion:  Penny Nash is a 52 y.o. female who is submitted for pre-surgical anesthesia review and clearance prior to her undergoing the above procedure. Patient has never been a smoker. Pertinent PMH includes: CAD, anterior STEMI, ischemic cardiomyopathy, chronic systolic heart failure, HTN, HLD, prediabetes, remote Scarlet fever, Turner Syndrome, osteoporosis, RIGHT hallux valgus with bunion, RIGHT hammertoe, hepatic steatosis with resulting transaminitis.  Patient is followed by cardiology Penny Dace, MD). She was last seen in the  cardiology clinic on 03/01/2022; notes reviewed. At the time of her clinic visit, patient doing well overall from a cardiovascular perspective. Patient denied any chest pain, shortness of breath, PND, orthopnea, palpitations, significant peripheral edema, weakness, fatigue, vertiginous symptoms, or presyncope/syncope. Patient with a past medical history significant for cardiovascular diagnoses. Documented physical exam was grossly benign, providing no evidence of acute exacerbation and/or decompensation of the patient's known cardiovascular conditions.  Patient suffered an anterior wall STEMI on 02/11/2020.  Diagnostic LEFT heart catheterization was performed revealing 75% stenosis of the proximal LAD, 100% stenosis of the mid LAD, and 50% stenosis of the mid to distal LAD.  PCI was performed placing a 3.5 x 12 mm Synergy DES x 1 to the proximal LAD.  Mid LAD felt to be the culprit lesion. Patient required aspiration thrombectomy + IVUS + PTCA + placement of a 2.25 x 24 mm Synergy DES to the mid LAD lesion.  Interventions yielded excellent angiographic result and TIMI-3 flow.  Most recent TTE was performed on 02/12/2020 revealing a mildly reduced left ventricular systolic function with an EF of 40-45%.  There was severe hypokinesis of the mid anterior wall/septum into the apex and around the apical septum consistent with LAD infarction.  Right ventricular size and function normal.  Diastolic Doppler parameters were normal.  There was trivial mitral valve regurgitation.  All transvalvular gradients were noted to be normal providing no evidence suggestive of valvular stenosis.  Of note, patient with a history of Turner syndrome.  Aortic valve was confirmed to be tricuspid on exam.  Aorta was normal in size with no evidence of aneurysmal dilatation.  Following stent placement, patient remains on daily DAPT therapy using  ASA + clopidogrel.  Patient reported to be compliant with therapy with no evidence or reports  of GI/GU related bleeding. Blood pressure well controlled at 96/62 mmHg on currently prescribed beta-blocker (metoprolol succinate) and ACEi (lisinopril) therapies.  Patient is on atorvastatin for her HLD diagnosis and ASCVD prevention.  Patient has a supply of short acting nitrates (NTG) to use on a as needed basis for recurrent angina/anginal equivalent symptoms; denied recent use.  Patient has a prediabetes diagnosis; last HgbA1c demonstrated good glycemic control as evidenced by a value of 5.6% on 10/28/2022. Patient is able to complete all of her  ADL/IADLs without cardiovascular limitation.  Per the DASI, patient is able to achieve at least 4 METS of physical activity without experiencing any significant degree of angina/anginal equivalent symptoms.  No changes were made to her medication regimen.  Patient to follow-up with outpatient cardiology in 1 year or sooner if needed.  Penny Nash is scheduled for an METATARSAL HEAD RESECTION 5TH TOE RIGHT FOOT (Right: Toe); OSTECTOMY COMPLETE MET HEAD RESECTION 3 TOES RIGHT FOOT; EXOSTECTOMY 1 TOE RIGHT FOOT (Right); HAMMER TOE CORRECTION 4 TOES RIGHT FOOT (Right: Toe); HALLUX MPJ FUSION (Right); BONE GRAFT (Right: Foot) on 01/15/2023 with Dr. Sharl Ma, DPM.  Given patient's past medical history significant for cardiovascular diagnoses, presurgical cardiac clearance was sought by the PAT team. Additionally, office requested cleared from patient' s PCP. Clearances were obtained as follows:  Per family medicine Penny Bailey, MD), "no medical contraindication. See cardiology recommendations. Patient may proceed as planned".   Per cardiology, "based ACC/AHA guidelines, the patient's past medical history, and the amount of time since her last clinic visit, this patient would be at an overall ACCEPTABLE risk for the planned procedure without further cardiovascular testing or intervention at this time".   Again, this patient is on daily DAPT therapy. She has  been instructed on recommendations for holding her clopidogrel for 5 days prior to her procedure with plans to restart as soon as postoperative bleeding risk felt to be minimized by her attending surgeon. The patient has been instructed that her last dose of her clopidogrel should be on 01/09/2023. Given her cardiovascular history, the patient will continue her daily low dose ASA throughout her perioperative course.   Patient denies previous perioperative complications with anesthesia in the past. In review of the EMR, there are no records available for review regarding any recent past surgical/anesthetic courses within the [facility] system.     01/08/2023   12:52 PM 09/26/2022    2:39 PM 03/01/2022    8:49 AM  Vitals with BMI  Height 4\' 9"   4\' 10"   Weight 112 lbs  109 lbs  BMI 24.23  22.79  Systolic  113 96  Diastolic  68 62  Pulse   58    Providers/Specialists:   NOTE: Primary physician provider listed below. Patient may have been seen by APP or partner within same practice.   PROVIDER ROLE / SPECIALTY LAST Penny Nash, DPM Podiatry (Surgeon)  09/26/2022  Penny Mis, MD Primary Care Provider  12/27/2022  Lance Muss, MD  Cardiology  03/01/2022; update call with preop APP on 12/17/2022   Allergies:  Patient has no known allergies.  Current Home Medications:   No current facility-administered medications for this encounter.    alendronate (FOSAMAX) 70 MG tablet   aspirin EC 81 MG tablet   atorvastatin (LIPITOR) 80 MG tablet   CALCIUM PO   clopidogrel (PLAVIX) 75 MG tablet  lisinopril (ZESTRIL) 2.5 MG tablet   metoprolol succinate (TOPROL-XL) 25 MG 24 hr tablet   nitroGLYCERIN (NITROSTAT) 0.4 MG SL tablet   tretinoin (RETIN-A) 0.05 % cream   VITAMIN D PO   History:   Past Medical History:  Diagnosis Date   Age-related osteoporosis without current pathological fracture    Bunion    Cholelithiases    Chronic systolic heart failure (HCC)    a.) TTE  02/12/2020: EF 40-45%, mid anterior/septal wall with sever HK, norm RVSF, triv MR   Coronary artery disease    a.) LHC/PCI 02/11/2020: 75% pLAD (3.5 x 12 mm Synergy DES), 100% mLAD (asp thrombectomy + IVUS + PTCA+ 2.25 x 24 Synergy DES), 50% m-dLAD   Elevated LFTs    Hallux valgus with bunions, right    Hammertoe of right foot    Hepatic steatosis    Hip fracture (HCC) 2002   HTN (hypertension)    Hyperlipidemia with target LDL less than 70    Ischemic cardiomyopathy 01/2020   a.) LHC 02/11/2020: EF 45-50%; b.) TTE 02/12/2020: EF 40-45%   Long term current use of aspirin    Low bone density    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    On chronic clopidogrel therapy    Osteoporosis    Prediabetes    Scarlet fever    ST elevation myocardial infarction (STEMI) of anterior wall (HCC) 02/11/2020   a.) LHC 02/11/2023: 75% pLAD (3.5 x 12 mm Synergy DES), 100% mLAD (culprit lesion --> asp thrombectomy + IVUS + PTCA + 2.25 x 24 mm Synergy DES), 50% m-dLAD   Turner syndrome    a.) confirmed tricuspid AoV 02/12/2020   Past Surgical History:  Procedure Laterality Date   BREAST REDUCTION SURGERY Bilateral 2001 est   CORONARY ULTRASOUND/IVUS N/A 02/11/2020   Procedure: Intravascular Ultrasound/IVUS;  Surgeon: Corky Crafts, MD;  [facility] INVASIVE CV LAB;  Service: Cardiovascular;  Laterality: N/A;   CORONARY/GRAFT ACUTE MI REVASCULARIZATION N/A 02/11/2020   Procedure: Coronary/Graft Acute MI Revascularization;  Surgeon: Corky Crafts, MD;  Location: [facility] INVASIVE CV LAB;  Service: Cardiovascular;  Laterality: N/A;   HIP FRACTURE SURGERY Left 2003 est   LEFT HEART CATH AND CORONARY ANGIOGRAPHY N/A 02/11/2020   Procedure: LEFT HEART CATH AND CORONARY ANGIOGRAPHY;  Surgeon: Corky Crafts, MD;  Location: [facility] INVASIVE CV LAB;  Service: Cardiovascular;  Laterality: N/A;   Family History  Problem Relation Age of Onset   Breast cancer Mother    Mitral valve prolapse Sister    Social  History   Tobacco Use   Smoking status: Never   Smokeless tobacco: Never  Vaping Use   Vaping status: Never Used  Substance Use Topics   Alcohol use: Not Currently   Drug use: Not Currently    Pertinent Clinical Results:  LABS:   Component Ref Range & Units 10/28/2022  WBC 3.4 - 10.8 x10E3/uL 5.9  RBC 3.77 - 5.28 x10E6/uL 4.48  Hemoglobin 11.1 - 15.9 g/dL 16.1  Hematocrit 09.6 - 46.6 % 43.2  MCV 79 - 97 fL 96  MCH 26.6 - 33.0 pg 31.9  MCHC 31.5 - 35.7 g/dL 04.5  RDW 40.9 - 81.1 % 11.8  Platelet Count 150 - 450 x10E3/uL 201  Neutrophils Not Estab. % 69  Lymphs Relative Not Estab. % 21  Monocytes Not Estab. % 9  Eos Relative Not Estab. % 0  Basos Relative Not Estab. % 1  Neutrophils Absolute 1.4 - 7.0 x10E3/uL 4.1  Lymphocytes  Absolute 0.7 - 3.1 x10E3/uL 1.2  Monocytes Absolute 0.1 - 0.9 x10E3/uL 0.5  Eosinophils Absolute 0.0 - 0.4 x10E3/uL 0.0  Basophils Absolute 0.0 - 0.2 x10E3/uL 0.0  Immature Granulocytes Not Estab. % 0  Immature Grans (Abs) 0.0 - 0.1 x10E3/uL 0.0  Narrative Performed by [facility] Performed at:  [address] [facility] [address], [place], [place]  [id number] Lab Director: Jolene Schimke MD, Phone:  [phone] Specimen Collected: 10/28/22 13:35   Performed by: Verdell Carmine Last Resulted: 10/29/22 05:36  Received From: [facility]  Result Received: 12/09/22 11:30   Component Ref Range & Units 10/28/2022  Glucose 70 - 99 mg/dL 84  BUN 6 - 24 mg/dL 10  Creatinine 8.29 - 5.62 mg/dL 1.30  eGFR >86 VH/QIO/9.62 91  BUN/Creatinine Ratio 9 - 23 13  Sodium 134 - 144 mmol/L 139  Potassium 3.5 - 5.2 mmol/L 4.0  Chloride 96 - 106 mmol/L 103  CO2 20 - 29 mmol/L 24  CALCIUM 8.7 - 10.2 mg/dL 9.1  Total Protein 6.0 - 8.5 g/dL 6.7  Albumin, Serum 3.8 - 4.9 g/dL 4.5  Globulin, Total 1.5 - 4.5 g/dL 2.2  Total Bilirubin 0.0 - 1.2 mg/dL 0.6  Alkaline Phosphatase 44 - 121 IU/L 121  AST 0 - 40 IU/L 44 High   ALT (SGPT) 0  - 32 IU/L 42 High   Narrative Performed by [facility] Performed at:  [address] [facility] [address], [place], [place]  [id number] Lab Director: Jolene Schimke MD, Phone:  [phone] Specimen Collected: 10/28/22 13:35   Performed by: Verdell Carmine Last Resulted: 10/29/22 05:36  Received From: [facility]  Result Received: 12/09/22 11:30    ECG: Date: 01/10/2023 Time ECG obtained: 1123 AM Rate: 56 bpm Rhythm: sinus bradycardia Axis (leads I and aVF): Right axis deviation Intervals: PR 140 ms. QRS 86 ms. QTc 411 ms. ST segment and T wave changes: No evidence of acute ST segment elevation or depression.  Evidence of a possible age undetermined anteroseptal infarct present. Comparison: Similar to previous tracing obtained on 03/01/2022   IMAGING / PROCEDURES: DG FOOT 2 VIEWS RIGHT performed on 09/26/2022 Hallux interphalangeus deformity noted, she has digital contractures and previous brachymetatarsia correction  . TRANSTHORACIC ECHOCARDIOGRAM performed on 02/12/2020 There is severe hypokinesis of the mid anterior wall/septum into the apex and around the apical septum consistent with LAD infarction. Left ventricular ejection fraction, by estimation, is 40 to 45%. Left  ventricular ejection fraction by 2D MOD biplane is 44.3 %. The left ventricle has mildly decreased function. The left ventricle demonstrates regional wall motion abnormalities (see scoring diagram/findings for description). Left ventricular diastolic parameters were normal.  Right ventricular systolic function is normal. The right ventricular size is normal. Tricuspid regurgitation signal is inadequate for assessing PA pressure.  The mitral valve is grossly normal. Trivial mitral valve regurgitation. No evidence of mitral stenosis.  The aortic valve is tricuspid. Aortic valve regurgitation is not visualized. No aortic stenosis is present.  The inferior vena cava is normal in size with greater than 50% respiratory variability,  suggesting right atrial pressure of 3 mmHg.   LEFT HEART CATHETERIZATION AND CORONARY ANGIOGRAPHY performed on 02/11/2020 Mildly reduced left regular systolic function with an EF of 45-50%. Normal LVEDP Apical hypokinesis likely related to late presentation of acute anterior MI Significant CAD 75% pLAD 100% mLAD 50% m-dLAD Successful PCI Aspiration thrombectomy + IVUS + PTCA + 2.25 x 24 mm Synergy DES to mLAD 3.5 x 12 mm Synergy DES to pLAD No aortic valve  stenosis Recommendations Continue aggressive secondary prevention.  Beta blocker and high dose statin started.  Watch in ICU.  Check echocardiogram for evidence of LV thrombus.  Due to her late presentation, I would not be surprised if she does have some LV dysfunction. She will need medical therapy to help improve LVEF.  No other targets for PCI as her mid to distal LAD is diffusely calcified and diseased.    Impression and Plan:  Penny Nash has been referred for pre-anesthesia review and clearance prior to her undergoing the planned anesthetic and procedural courses. Available labs, pertinent testing, and imaging results were personally reviewed by me in preparation for upcoming operative/procedural course. [facility] has been updated following extensive record review and patient interview with PAT staff.   This patient has been appropriately cleared by cardiology (ACCEPTABLE) and by internal/family medicine (ACCEPTABLE) with the individually indicated risk of significant perioperative complications. Based on clinical review performed today (01/13/23), barring any significant acute changes in the patient's overall condition, it is anticipated that she will be able to proceed with the planned surgical intervention. Any acute changes in clinical condition may necessitate her procedure being postponed and/or cancelled. Patient will meet with anesthesia team (MD and/or CRNA) on the day of her procedure for  preoperative evaluation/assessment. Questions regarding anesthetic course will be fielded at that time.   Pre-surgical instructions were reviewed with the patient during her PAT appointment, and questions were fielded to satisfaction by PAT clinical staff. She has been instructed on which medications that she will need to hold prior to surgery, as well as the ones that have been deemed safe/appropriate to take on the day of her procedure. As part of the general education provided by PAT, patient made aware both verbally and in writing, that she would need to abstain from the use of any illegal substances during her perioperative course.  She was advised that failure to follow the provided instructions could necessitate case cancellation or result in serious perioperative complications up to and including death. Patient encouraged to contact PAT and/or her surgeon's office to discuss any questions or concerns that may arise prior to surgery; verbalized understanding.   Quentin Mulling, MSN, APRN, FNP-C, CEN [facility]  Perioperative Services Nurse Practitioner Phone: [phone] Fax: [phone] 01/13/23 2:51 PM  NOTE: This note has been prepared using Dragon dictation software. Despite my best ability to proofread, there is always the potential that unintentional transcriptional errors may still occur from this process.

## 2023-01-15 ENCOUNTER — Other Ambulatory Visit: Payer: Self-pay

## 2023-01-15 ENCOUNTER — Encounter
Admission: RE | Disposition: A | Discharge status: Home / Self Care | Payer: Self-pay | Source: Home / Self Care | Attending: Podiatry

## 2023-01-15 ENCOUNTER — Encounter: Payer: Self-pay | Admitting: Podiatry

## 2023-01-15 ENCOUNTER — Ambulatory Visit: Payer: Self-pay | Admitting: Urgent Care

## 2023-01-15 ENCOUNTER — Ambulatory Visit
Admission: RE | Admit: 2023-01-15 | Discharge: 2023-01-15 | Disposition: A | Discharge status: Home / Self Care | Payer: BC Managed Care – PPO | Attending: Podiatry | Admitting: Podiatry

## 2023-01-15 ENCOUNTER — Ambulatory Visit: Payer: BC Managed Care – PPO | Admitting: Urgent Care

## 2023-01-15 ENCOUNTER — Ambulatory Visit: Payer: BC Managed Care – PPO

## 2023-01-15 DIAGNOSIS — M7741 Metatarsalgia, right foot: Secondary | ICD-10-CM

## 2023-01-15 DIAGNOSIS — I252 Old myocardial infarction: Secondary | ICD-10-CM | POA: Diagnosis not present

## 2023-01-15 DIAGNOSIS — M2041 Other hammer toe(s) (acquired), right foot: Secondary | ICD-10-CM | POA: Diagnosis not present

## 2023-01-15 DIAGNOSIS — K760 Fatty (change of) liver, not elsewhere classified: Secondary | ICD-10-CM | POA: Insufficient documentation

## 2023-01-15 DIAGNOSIS — M21611 Bunion of right foot: Secondary | ICD-10-CM | POA: Insufficient documentation

## 2023-01-15 DIAGNOSIS — M19071 Primary osteoarthritis, right ankle and foot: Secondary | ICD-10-CM | POA: Diagnosis not present

## 2023-01-15 DIAGNOSIS — Z7982 Long term (current) use of aspirin: Secondary | ICD-10-CM | POA: Insufficient documentation

## 2023-01-15 DIAGNOSIS — M8970 Major osseous defect, unspecified site: Secondary | ICD-10-CM

## 2023-01-15 DIAGNOSIS — I2510 Atherosclerotic heart disease of native coronary artery without angina pectoris: Secondary | ICD-10-CM | POA: Insufficient documentation

## 2023-01-15 DIAGNOSIS — I110 Hypertensive heart disease with heart failure: Secondary | ICD-10-CM | POA: Insufficient documentation

## 2023-01-15 DIAGNOSIS — R7303 Prediabetes: Secondary | ICD-10-CM | POA: Insufficient documentation

## 2023-01-15 DIAGNOSIS — M205X1 Other deformities of toe(s) (acquired), right foot: Secondary | ICD-10-CM | POA: Insufficient documentation

## 2023-01-15 DIAGNOSIS — M21961 Unspecified acquired deformity of right lower leg: Secondary | ICD-10-CM | POA: Diagnosis not present

## 2023-01-15 DIAGNOSIS — E785 Hyperlipidemia, unspecified: Secondary | ICD-10-CM | POA: Diagnosis not present

## 2023-01-15 DIAGNOSIS — M216X1 Other acquired deformities of right foot: Secondary | ICD-10-CM | POA: Insufficient documentation

## 2023-01-15 DIAGNOSIS — M2011 Hallux valgus (acquired), right foot: Secondary | ICD-10-CM | POA: Diagnosis not present

## 2023-01-15 DIAGNOSIS — I5022 Chronic systolic (congestive) heart failure: Secondary | ICD-10-CM | POA: Insufficient documentation

## 2023-01-15 DIAGNOSIS — I255 Ischemic cardiomyopathy: Secondary | ICD-10-CM | POA: Insufficient documentation

## 2023-01-15 DIAGNOSIS — M810 Age-related osteoporosis without current pathological fracture: Secondary | ICD-10-CM | POA: Insufficient documentation

## 2023-01-15 DIAGNOSIS — M2031 Hallux varus (acquired), right foot: Secondary | ICD-10-CM

## 2023-01-15 DIAGNOSIS — Z7902 Long term (current) use of antithrombotics/antiplatelets: Secondary | ICD-10-CM | POA: Insufficient documentation

## 2023-01-15 DIAGNOSIS — Q969 Turner's syndrome, unspecified: Secondary | ICD-10-CM | POA: Insufficient documentation

## 2023-01-15 DIAGNOSIS — Z955 Presence of coronary angioplasty implant and graft: Secondary | ICD-10-CM | POA: Diagnosis not present

## 2023-01-15 HISTORY — DX: Long term (current) use of aspirin: Z79.82

## 2023-01-15 HISTORY — DX: Essential (primary) hypertension: I10

## 2023-01-15 HISTORY — DX: Calculus of gallbladder without cholecystitis without obstruction: K80.20

## 2023-01-15 HISTORY — PX: HALLUX FUSION: SHX6621

## 2023-01-15 HISTORY — DX: Long term (current) use of anticoagulants: Z79.01

## 2023-01-15 HISTORY — DX: Fatty (change of) liver, not elsewhere classified: K76.0

## 2023-01-15 HISTORY — PX: METATARSAL HEAD EXCISION: SHX5027

## 2023-01-15 HISTORY — DX: Prediabetes: R73.03

## 2023-01-15 HISTORY — PX: OSTECTOMY: SHX6439

## 2023-01-15 HISTORY — PX: HAMMER TOE SURGERY: SHX385

## 2023-01-15 HISTORY — PX: FOOT ARTHRODESIS: SHX1655

## 2023-01-15 HISTORY — DX: Chronic systolic (congestive) heart failure: I50.22

## 2023-01-15 SURGERY — EXCISION, METATARSAL BONE, HEAD
Anesthesia: General | Site: Toe | Laterality: Right

## 2023-01-15 MED ORDER — ONDANSETRON HCL 4 MG/2ML IJ SOLN
INTRAMUSCULAR | Status: DC | PRN
  Administered 2023-01-15: 4 mg via INTRAVENOUS

## 2023-01-15 MED ORDER — FENTANYL CITRATE (PF) 100 MCG/2ML IJ SOLN: 25.0000 ug | INTRAMUSCULAR | Status: DC | PRN

## 2023-01-15 MED ORDER — DEXAMETHASONE SODIUM PHOSPHATE 10 MG/ML IJ SOLN
INTRAMUSCULAR | Status: DC | PRN
  Administered 2023-01-15: 5 mg via INTRAVENOUS

## 2023-01-15 MED ORDER — ONDANSETRON HCL 4 MG/2ML IJ SOLN
INTRAMUSCULAR | Status: AC
Start: 2023-01-15 — End: ?
  Filled 2023-01-15: qty 2

## 2023-01-15 MED ORDER — FENTANYL CITRATE (PF) 100 MCG/2ML IJ SOLN
INTRAMUSCULAR | Status: DC | PRN
  Administered 2023-01-15 (×4): 25 ug via INTRAVENOUS

## 2023-01-15 MED ORDER — PROPOFOL 1000 MG/100ML IV EMUL
INTRAVENOUS | Status: AC
Start: 2023-01-15 — End: ?
  Filled 2023-01-15: qty 100

## 2023-01-15 MED ORDER — DEXAMETHASONE SODIUM PHOSPHATE 10 MG/ML IJ SOLN
INTRAMUSCULAR | Status: AC
  Filled 2023-01-15: qty 1

## 2023-01-15 MED ORDER — ORAL CARE MOUTH RINSE: 15.0000 mL | Freq: Once | OROMUCOSAL | Status: AC

## 2023-01-15 MED ORDER — 0.9 % SODIUM CHLORIDE (POUR BTL) OPTIME
TOPICAL | Status: DC | PRN
  Administered 2023-01-15: 500 mL

## 2023-01-15 MED ORDER — CHLORHEXIDINE GLUCONATE 0.12 % MT SOLN
OROMUCOSAL | Status: AC
  Filled 2023-01-15: qty 15

## 2023-01-15 MED ORDER — BUPIVACAINE HCL (PF) 0.5 % IJ SOLN
INTRAMUSCULAR | Status: AC
  Filled 2023-01-15: qty 30

## 2023-01-15 MED ORDER — CHLORHEXIDINE GLUCONATE 0.12 % MT SOLN
15.0000 mL | Freq: Once | OROMUCOSAL | Status: AC
  Administered 2023-01-15: 15 mL via OROMUCOSAL

## 2023-01-15 MED ORDER — MIDAZOLAM HCL 2 MG/2ML IJ SOLN
INTRAMUSCULAR | Status: AC
Start: 2023-01-15 — End: ?
  Filled 2023-01-15: qty 2

## 2023-01-15 MED ORDER — PHENYLEPHRINE 80 MCG/ML (10ML) SYRINGE FOR IV PUSH (FOR BLOOD PRESSURE SUPPORT)
PREFILLED_SYRINGE | INTRAVENOUS | Status: AC
  Filled 2023-01-15: qty 10

## 2023-01-15 MED ORDER — MIDAZOLAM HCL 2 MG/2ML IJ SOLN
INTRAMUSCULAR | Status: DC | PRN
  Administered 2023-01-15: 2 mg via INTRAVENOUS

## 2023-01-15 MED ORDER — BUPIVACAINE LIPOSOME 1.3 % IJ SUSP
INTRAMUSCULAR | Status: AC
Start: 2023-01-15 — End: ?
  Filled 2023-01-15: qty 10

## 2023-01-15 MED ORDER — CEPHALEXIN 500 MG PO CAPS: 500.0000 mg | ORAL_CAPSULE | Freq: Three times a day (TID) | ORAL | 0 refills | Status: AC

## 2023-01-15 MED ORDER — FENTANYL CITRATE (PF) 100 MCG/2ML IJ SOLN
INTRAMUSCULAR | Status: AC
  Filled 2023-01-15: qty 2

## 2023-01-15 MED ORDER — BUPIVACAINE LIPOSOME 1.3 % IJ SUSP
INTRAMUSCULAR | Status: DC | PRN
  Administered 2023-01-15: 30 mL

## 2023-01-15 MED ORDER — OXYCODONE HCL 5 MG PO TABS: 5.0000 mg | ORAL_TABLET | ORAL | 0 refills | Status: AC | PRN

## 2023-01-15 MED ORDER — EPHEDRINE 5 MG/ML INJ
INTRAVENOUS | Status: AC
  Filled 2023-01-15: qty 5

## 2023-01-15 MED ORDER — PHENYLEPHRINE 80 MCG/ML (10ML) SYRINGE FOR IV PUSH (FOR BLOOD PRESSURE SUPPORT)
PREFILLED_SYRINGE | INTRAVENOUS | Status: DC | PRN
  Administered 2023-01-15 (×2): 80 ug via INTRAVENOUS

## 2023-01-15 MED ORDER — PROPOFOL 10 MG/ML IV BOLUS
INTRAVENOUS | Status: DC | PRN
  Administered 2023-01-15: 120 mg via INTRAVENOUS

## 2023-01-15 MED ORDER — ACETAMINOPHEN 500 MG PO TABS: 1000.0000 mg | ORAL_TABLET | Freq: Four times a day (QID) | ORAL | 0 refills | Status: AC | PRN

## 2023-01-15 MED ORDER — GABAPENTIN 300 MG PO CAPS: 300.0000 mg | ORAL_CAPSULE | Freq: Three times a day (TID) | ORAL | 0 refills | Status: AC

## 2023-01-15 MED ORDER — SEVOFLURANE IN SOLN
RESPIRATORY_TRACT | Status: AC
  Filled 2023-01-15: qty 250

## 2023-01-15 MED ORDER — DROPERIDOL 2.5 MG/ML IJ SOLN: 0.6250 mg | Freq: Once | INTRAMUSCULAR | Status: DC | PRN

## 2023-01-15 MED ORDER — CEFAZOLIN SODIUM-DEXTROSE 2-4 GM/100ML-% IV SOLN
INTRAVENOUS | Status: AC
  Filled 2023-01-15: qty 100

## 2023-01-15 MED ORDER — DEXMEDETOMIDINE HCL IN NACL 80 MCG/20ML IV SOLN
INTRAVENOUS | Status: DC | PRN
  Administered 2023-01-15: 4 ug via INTRAVENOUS

## 2023-01-15 MED ORDER — LACTATED RINGERS IV SOLN: INTRAVENOUS | Status: DC

## 2023-01-15 MED ORDER — EPHEDRINE SULFATE-NACL 50-0.9 MG/10ML-% IV SOSY
PREFILLED_SYRINGE | INTRAVENOUS | Status: DC | PRN
  Administered 2023-01-15: 10 mg via INTRAVENOUS
  Administered 2023-01-15: 5 mg via INTRAVENOUS

## 2023-01-15 MED ORDER — CEFAZOLIN SODIUM-DEXTROSE 2-4 GM/100ML-% IV SOLN
2.0000 g | INTRAVENOUS | Status: AC
  Administered 2023-01-15: 2 g via INTRAVENOUS

## 2023-01-15 SURGICAL SUPPLY — 51 items
BASIN GRAD PLASTIC 32OZ STRL (MISCELLANEOUS) ×2 IMPLANT
BIT DRILL 2.2 CANN STRGHT (BIT) IMPLANT
BIT DRILL CANN F/COMP 2.2 (BIT) IMPLANT
BLADE MED AGGRESSIVE (BLADE) ×2 IMPLANT
BLADE OSC/SAGITTAL MD 5.5X18 (BLADE) IMPLANT
BLADE SURG MINI STRL (BLADE) ×2 IMPLANT
BNDG COHESIVE 4X5 TAN STRL LF (GAUZE/BANDAGES/DRESSINGS) ×2 IMPLANT
BNDG ESMARCH 4X12 STRL LF (GAUZE/BANDAGES/DRESSINGS) ×2 IMPLANT
BNDG GAUZE DERMACEA FLUFF 4 (GAUZE/BANDAGES/DRESSINGS) ×2 IMPLANT
BOOT STEPPER DURA SM (SOFTGOODS) IMPLANT
CHLORAPREP W/TINT 26 (MISCELLANEOUS) ×2 IMPLANT
COVER PIN YLW 0.028-062 (MISCELLANEOUS) ×4 IMPLANT
DRAPE FLUOR MINI C-ARM 54X84 (DRAPES) ×2 IMPLANT
ELECT REM PT RETURN 9FT ADLT (ELECTROSURGICAL) ×2
ELECTRODE REM PT RTRN 9FT ADLT (ELECTROSURGICAL) ×2 IMPLANT
GAUZE SPONGE 4X4 12PLY STRL (GAUZE/BANDAGES/DRESSINGS) ×2 IMPLANT
GAUZE XEROFORM 1X8 LF (GAUZE/BANDAGES/DRESSINGS) ×2 IMPLANT
GLOVE SURG ORTHO 7.0 STRL STRW (GLOVE) ×2 IMPLANT
GOWN STRL REUS W/ TWL LRG LVL3 (GOWN DISPOSABLE) ×2 IMPLANT
GUIDEWIRE .045XTROC TIP LSR LN (WIRE) IMPLANT
GUIDEWIRE 1.6 FLEX (Wire) IMPLANT
GUIDEWIRE ORTH 157X1.6XTROC (WIRE) IMPLANT
HARVESTER BONE GRAFT 6 (INSTRUMENTS) IMPLANT
KIT TURNOVER KIT A (KITS) ×2 IMPLANT
MANIFOLD NEPTUNE II (INSTRUMENTS) ×2 IMPLANT
NDL FILTER BLUNT 18X1 1/2 (NEEDLE) ×2 IMPLANT
NEEDLE FILTER BLUNT 18X1 1/2 (NEEDLE) ×2
NS IRRIG 500ML POUR BTL (IV SOLUTION) ×2 IMPLANT
PACK EXTREMITY ARMC (MISCELLANEOUS) ×2 IMPLANT
PIN BB TAK MTP SU (PIN) IMPLANT
PLATE PETITE 5D ANG RT (Plate) IMPLANT
REAMER METATARSAL 18MM (MISCELLANEOUS) IMPLANT
REAMER PHALANGEAL 18MM (MISCELLANEOUS) IMPLANT
SCREW CANN FT QF 3.6X28 (Screw) IMPLANT
SCREW CORTICAL 3.0X20 (Screw) IMPLANT
SCREW LOCK COMP 3X16 (Screw) IMPLANT
SCREW VAL KREULOCK 3.0X12 TI (Screw) IMPLANT
SCREW VAL KREULOCK 3.0X14 TI (Screw) IMPLANT
SCREW VAL KREULOCK 3X20 (Screw) IMPLANT
SPLINT CAST 1 STEP 4X30 (MISCELLANEOUS) ×4 IMPLANT
STAPLE NITINOL 11WX10L W/INST (Staple) IMPLANT
STOCKINETTE STRL 6IN 960660 (GAUZE/BANDAGES/DRESSINGS) ×2 IMPLANT
SUT ETHILON 4 0 PS 2 18 (SUTURE) IMPLANT
SUT ETHILON 4-0 FS2 18XMFL BLK (SUTURE) ×4
SUT MNCRL AB 3-0 PS2 27 (SUTURE) IMPLANT
SUT VIC AB 4-0 FS2 27 (SUTURE) ×4 IMPLANT
SUT VICRYL AB 3-0 FS1 BRD 27IN (SUTURE) ×2 IMPLANT
SUTURE ETHLN 4-0 FS2 18XMF BLK (SUTURE) ×2 IMPLANT
SYR 10ML LL (SYRINGE) ×4 IMPLANT
TRAP FLUID SMOKE EVACUATOR (MISCELLANEOUS) ×2 IMPLANT
WATER STERILE IRR 500ML POUR (IV SOLUTION) ×2 IMPLANT

## 2023-01-15 NOTE — Discharge Instructions (Addendum)
Post-Surgery Instructions  1. If you are recuperating from surgery anywhere other than home, please be sure to leave [place] a number where you can be reached. 2. Go directly home and rest. 3. The keep operated foot (or feet) elevated six inches above the hip when sitting or lying down. 4. Support the elevated foot and leg with pillows under the calf. DO NOT PLACE PILLOWS UNDER THE KNEE. 5. DO NOT REMOVE or get your bandages wet. This will increase your chances of getting an infection. 6. Wear your surgical shoe at all times when you are up. 7. A limited amount of pain and swelling may occur. The skin may take on a bruised appearance. This is no cause for alarm. 8. For pain and swelling, apply an ice pack behind the knee for 15 minutes every hour. Continue icing until seen in the office. DO NOT apply any form of heat to the area. 9. Have prescription(s) filled immediately and take as directed. 10. Drink lots of liquids, water, and juice. 11. CALL THE OFFICE IMMEDIATELY IF: a. Bleeding continues b. Pain increases and/or does not respond to medication c. Bandage or cast appears too tight d. Any liquids (water, coffee, etc.) have spilled on your bandages. e. Tripping, falling, or stubbing the surgical foot f. If your temperature rises above 101 g. If you have ANY questions at all 12. Please use the crutches, knee scooter, or walker you have prescribed, rented, or purchased. If you are non-weight bearing DO NOT put weight on the operated foot for 21 days. If you are weight-bearing, follow your physician's instructions. You are expected to be:   ? non-weight bearing 13. Special Instructions: _____________________________________________________________ _________________________________________________________________________________ _________________________________________________________________________________  14. Your next appointment is:  01/21/2023 9:15 AM     If you need to reach  the nurse for any reason, please call: [place]: [phone] [place]: [phone] [place]: [phone]

## 2023-01-15 NOTE — Anesthesia Preprocedure Evaluation (Signed)
Anesthesia Evaluation  Patient identified by MRN, date of birth, ID band Patient awake    Reviewed: Allergy & Precautions, H&P , NPO status , Patient's Chart, lab work & pertinent test results, reviewed documented beta blocker date and time   History of Anesthesia Complications Negative for: history of anesthetic complications  Airway Mallampati: III  TM Distance: >3 FB Neck ROM: full    Dental  (+) Dental Advidsory Given, Caps, Teeth Intact   Pulmonary neg pulmonary ROS, Continuous Positive Airway Pressure Ventilation    Pulmonary exam normal breath sounds clear to auscultation       Cardiovascular Exercise Tolerance: Good hypertension, (-) angina + CAD, + Past MI and + Cardiac Stents  Normal cardiovascular exam(-) dysrhythmias (-) Valvular Problems/Murmurs Rhythm:regular Rate:Normal     Neuro/Psych negative neurological ROS  negative psych ROS   GI/Hepatic negative GI ROS, Neg liver ROS,,,  Endo/Other  negative endocrine ROS    Renal/GU negative Renal ROS  negative genitourinary   Musculoskeletal   Abdominal   Peds  Hematology negative hematology ROS (+)   Anesthesia Other Findings Past Medical History: No date: Age-related osteoporosis without current pathological  fracture No date: Bunion No date: Cholelithiases No date: Chronic systolic heart failure (HCC)     Comment:  a.) TTE 02/12/2020: EF 40-45%, mid anterior/septal wall               with sever HK, norm RVSF, triv MR No date: Coronary artery disease     Comment:  a.) LHC/PCI 02/11/2020: 75% pLAD (3.5 x 12 mm Synergy               DES), 100% mLAD (asp thrombectomy + IVUS + PTCA+ 2.25 x               24 Synergy DES), 50% m-dLAD No date: Elevated LFTs No date: Hallux valgus with bunions, right No date: Hammertoe of right foot No date: Hepatic steatosis 2002: Hip fracture (HCC) No date: HTN (hypertension) No date: Hyperlipidemia with target LDL less  than 70 01/2020: Ischemic cardiomyopathy     Comment:  a.) LHC 02/11/2020: EF 45-50%; b.) TTE 02/12/2020: EF               40-45% No date: Long term current use of aspirin No date: Low bone density No date: 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 No date: On chronic clopidogrel therapy No date: Osteoporosis No date: Prediabetes No date: Scarlet fever 02/11/2020: ST elevation myocardial infarction (STEMI) of anterior  wall [facility])     Comment:  a.) LHC 02/11/2023: 75% pLAD (3.5 x 12 mm Synergy DES),               100% mLAD (culprit lesion --> asp thrombectomy + IVUS +               PTCA + 2.25 x 24 mm Synergy DES), 50% m-dLAD No date: Turner syndrome     Comment:  a.) confirmed tricuspid AoV 02/12/2020   Reproductive/Obstetrics negative OB ROS                             Anesthesia Physical Anesthesia Plan  ASA: 2  Anesthesia Plan: General   Post-op Pain Management:    Induction: Intravenous  PONV Risk Score and Plan: 3 and Ondansetron, Dexamethasone, Midazolam and Treatment may vary due to age or medical condition  Airway Management Planned: LMA  Additional Equipment:   Intra-op Plan:  Post-operative Plan: Extubation in OR  Informed Consent: I have reviewed the patients History and Physical, chart, labs and discussed the procedure including the risks, benefits and alternatives for the proposed anesthesia with the patient or authorized representative who has indicated his/her understanding and acceptance.     Dental Advisory Given  Plan Discussed with: Anesthesiologist, CRNA and Surgeon  Anesthesia Plan Comments:        Anesthesia Quick Evaluation

## 2023-01-15 NOTE — Op Note (Signed)
Patient Name: Penny Nash DOB: Mar 30, 1970  MRN: [id number]   Date of Service: 01/15/2023  Surgeon: Dr. Sharl Ma, DPM Assistants: None Pre-operative Diagnosis:  Hallux interphalangeus Hallux valgus Hammertoe 2,3,4,5 Metatarsalgia 2,3,4,5 Metatarsal deformity 2,3,4,5 Osteoarthritis metatarsophalangeal joint Post-operative Diagnosis:  Hallux interphalangeus Hallux valgus Hammertoe 2,3,4,5 Metatarsalgia 2,3,4,5 Metatarsal deformity 2,3,4,5 Osteoarthritis metatarsophalangeal joint Procedures: 1st MTP arthrodesis right foot Hammertoe correction 2,3,4,5 right foot Metatarsal head resection 2,3,4,5 right foot Akin osteotomy 1st phalanx Bone graft minor  Pathology/Specimens: * No specimens in log * Anesthesia: General with ankle block Hemostasis:  Total Tourniquet Time Documented: Calf (Right) - 120 minutes Total: Calf (Right) - 120 minutes  Estimated Blood Loss: 10 mL Materials:  Implant Name Type Inv. Item Serial No. Manufacturer Lot No. LRB No. Used Action  DynaNite nitinol staple with instruentation [id number]    [employer] [id number] Right 1 Implanted  flex wire 1.30mm double tipped nitinol g wire two zone    [employer] [id number] Right 4 Implanted  PLATE PETITE 5D ANG RT - [id number] Plate PLATE PETITE 5D ANG RT  [employer]  Right 1 Implanted  SCREW VAL KREULOCK 3.0X12 TI - [id number] Screw SCREW VAL KREULOCK 3.0X12 TI  [employer]  Right 1 Implanted  SCREW VAL KREULOCK 3.0X14 TI - [id number] Screw SCREW VAL KREULOCK 3.0X14 TI  [employer]  Right 2 Implanted  SCREW CORTICAL 3.0X20 - [id number] Screw SCREW CORTICAL 3.0X20  [employer]  Right 1 Implanted  SCREW LOCK COMP 3X16 - [id number] Screw SCREW LOCK COMP 3X16  [employer]  Right 1 Implanted  SCREW VAL KREULOCK 3X20 - [id number] Screw SCREW VAL KREULOCK 3X20  [employer]  Right 1 Implanted  SCREW CANN FT QF 3.6X28 - [id number] Screw SCREW CANN FT QF 3.6X28  [employer]  Right 1 Implanted   Medications: 10cc  exparel mixed with 20cc 0.5% marcaine plain Complications: none  Indications for Procedure:  This is a 52 y.o. female with a history of multiple forefoot deformities and prior brachymetatarsia with callus distraction correction with malformed toes and metatarsals and osteoarthritis of the metatarsophalangeal joint. After failing non operative treatment she elected for operative intervention. All risks, benefits and potential complications discussed prior to the procedure. All questions addressed. Informed consent signed and reviewed.      Procedure in Detail: After identifying the patient in the pre-operative holding area and marking the right lower extremity as the correct side and site of surgery, the patient  was brought to the operating room and transferred from the stretcher to the operating room table and placed in the supine position.  A safety belt was placed and secured around her waist.  A timeout was then performed.  The patient was then sedated with general anesthesia by the anesthesia team. The right lower extremity was then prepped and draped in the usual sterile fashion.  The right lower extremity was elevated, exsanguinated and the pneumatic calf tourniquet was inflated to 250 mmHg.    An incision was made over the first metatarsophalangeal joint.  Dissection was carried deep to subcutaneous tissue.  The deep fascia was incised medial to the EHL tendon.  The capsule reflected from the first metatarsal phalangeal joint and the metatarsal head and base of the proximal phalanx were exposed.  A guidewire for the cup reamer was placed in the metatarsal head.  The metatarsal head was denuded of cartilage and subchondral bone plate to healthy bleeding bone.  The guidewire was then removed and placed in the base of  the proximal phalanx.  The cone reamer was then used to also denude the base of the proximal phalanx of cartilage and subchondral bone plate to healthy bleeding bone.  The fusion  surfaces were then thoroughly irrigated of all debris. They were then fenestrated with a 2.97mm drill bit.  Attention was directed to the lateral heel. A small incision was made and the soft tissues elevated from the lateral calcaneal wall. The bone graft reamer was used to harvest approximately 1-2cc of autogenous bone graft which was placed in to the fusion site. The hallux was then placed into the appropriate position manually and a guidewire for the 3.5 mm FT screw was then placed from distal medial to proximal lateral.  At this  point the hallux abductus angle was corrected but she had curvature of the phalanx with hallux interphalangeus deformity was well. I elected to mak an Akin closing wedge medial osteotomy in the phalanx. This was done with a sagittal saw. The wedge was removed and closed manually. An [employer] 11mm staple was used to fixate this with good correction and compression noted. The MTP joint was then re-evaluated and placed into the appropriate position, with the guidewire now oriented from distal lateral to proximal medial. Once temporary fixation was in place the MaxTorque was selected and positioned. it was temporally fixated with plate tacks and then the distal screws were fixated to the bone.  Once this was completely temporary plate tacks were removed and the oblong screw hole was used to create compression across the fusion surface.  Locking screws were used to complete the construct to the metatarsal.  Good correction position and compression of the fusion site was noted.  The FT screw was then measured and drilled and inserted with good compression and stability noted.  An incision was made with a #15 blade over the second PIPJ. Dissection was carried deep through subcutaneous and deep tissue, ensuring all vital neurovascular structures were retracted form the field. Small bleeding veins were clamped and tied or cauterized as necessary. The soft tissue attachments of the  interphalangeal joint were released. The head of the proximal phalanx and base of the distal phalanx was resected with saw and rongeur. A 0.062" nitinol flex wire was driven distally out the tip of the toe and retrograde to the level of the proximal phalanx base. Fluoroscopy confirmed position and correction. Separate but identical procedures were then performed on the third, fourth, and fifth interphalangeal joints.  I then directed my attention to the second interspace. Blunt dissection was used to elevate the soft tissues. A dorsal capsulotomy was made.  A McGlamry elevator was used to free the soft tissue from the metatarsal head of the metatarsal phalangeal joint.  A sagittal saw was used to create a transverse slightly angled from dorsal distal to proximal plantar osteotomy through the metatarsal neck.  The metatarsal head was resected and removed.  Separate but identical procedures for this were then performed on the third fourth and fifth metatarsals which were accessed through a separate fourth interspace incision.  Following this the wires were driven through the base of the phalanx and into the diaphysis and shaft of the metatarsal with appropriate spacing at the metatarsal phalangeal joint head resection remaining.  Closure was then initiated and consisted of 3-0 monocryl and 4-0 nylon. A postop dressing was applied consisting of xeroform, sterile gauze, 4 x 4, and Kerlix and an Ace wrap.  The patient tolerated the procedure well.  The tourniquet was then  deflated.  Immediate capillary refill was noted to all toes.  She was aroused from sedation and transferred back to the stretcher and to the post anesthesia care unit for further monitoring.  She was discharged to home in good condition, and she will be non weightbearing in a CAM walker boot.  She will follow up with me in 1 week for postop evaluation.   Disposition: Following a period of post-operative monitoring, patient will be transferred to  home.

## 2023-01-15 NOTE — Interval H&P Note (Signed)
History and Physical Interval Note:  01/15/2023 7:22 AM  Penny Nash  has presented today for surgery, with the diagnosis of 5 HAMMER TOE RIGHT FOOT.  The various methods of treatment have been discussed with the patient and family. After consideration of risks, benefits and other options for treatment, the patient has consented to  Procedure(s): METARSAL HEAD RESECTION 5TH TOE RIGHT FOOT (Right) OSTECTOMY COMPLETE MET HEAD RESECTION 3 TOES RIGHT FOOT; EXOSTECTOMY 1 TOE RIGHT FOOT (Right) HAMMER TOE CORRECTION 4 TOES RIGHT FOOT (Right) HALLUX MPJ FUSION (Right) BONE GRAFT (Right) as a surgical intervention.  The patient's history has been reviewed, patient examined, no change in status, stable for surgery.  I have reviewed the patient's chart and labs.  Questions were answered to the patient's satisfaction.     Edwin Cap

## 2023-01-15 NOTE — Anesthesia Procedure Notes (Signed)
Procedure Name: LMA Insertion Date/Time: 01/15/2023 7:43 AM  Performed by: Manning Charity, CRNAPre-anesthesia Checklist: Patient identified, Emergency Drugs available, Suction available, Patient being monitored and Timeout performed Patient Re-evaluated:Patient Re-evaluated prior to induction Oxygen Delivery Method: Circle system utilized Preoxygenation: Pre-oxygenation with 100% oxygen Induction Type: IV induction Ventilation: Mask ventilation without difficulty LMA Size: 3.0 Number of attempts: 2 Tube secured with: Tape

## 2023-01-15 NOTE — Transfer of Care (Signed)
Immediate Anesthesia Transfer of Care Note  Patient: Penny Nash  Procedure(s) Performed: METARSAL HEAD RESECTION 5TH TOE RIGHT FOOT (Right: Toe) OSTECTOMY COMPLETE MET HEAD RESECTION 3 TOES RIGHT FOOT; EXOSTECTOMY 1 TOE RIGHT FOOT (Right: Toe) HAMMER TOE CORRECTION 4 TOES RIGHT FOOT (Right: Toe) HALLUX MPJ FUSION (Right: Toe) BONE GRAFT (Right: Foot)  Patient Location: PACU  Anesthesia Type:General  Level of Consciousness: awake  Airway & Oxygen Therapy: Patient Spontanous Breathing and Patient connected to face mask oxygen  Post-op Assessment: Report given to RN and Post -op Vital signs reviewed and stable  Post vital signs: Reviewed and stable  Last Vitals:  Vitals Value Taken Time  BP 107/63 01/15/23 1100  Temp 36.9 C 01/15/23 1100  Pulse 83 01/15/23 1107  Resp 20 01/15/23 1107  SpO2 99 % 01/15/23 1107  Vitals shown include unfiled device data.  Last Pain:  Vitals:   01/15/23 1100  TempSrc:   PainSc: 0-No pain         Complications: No notable events documented.

## 2023-01-18 ENCOUNTER — Encounter: Payer: Self-pay | Admitting: Podiatry

## 2023-01-20 ENCOUNTER — Telehealth: Payer: Self-pay | Admitting: Podiatry

## 2023-01-20 DIAGNOSIS — R2689 Other abnormalities of gait and mobility: Secondary | ICD-10-CM

## 2023-01-20 NOTE — Anesthesia Postprocedure Evaluation (Signed)
Anesthesia Post Note  Patient: Penny Nash  Procedure(s) Performed: METARSAL HEAD RESECTION 5TH TOE RIGHT FOOT (Right: Toe) OSTECTOMY COMPLETE MET HEAD RESECTION 3 TOES RIGHT FOOT; EXOSTECTOMY 1 TOE RIGHT FOOT (Right: Toe) HAMMER TOE CORRECTION 4 TOES RIGHT FOOT (Right: Toe) HALLUX MPJ FUSION (Right: Toe) BONE GRAFT (Right: Foot)  Patient location during evaluation: PACU Anesthesia Type: General Level of consciousness: awake and alert Pain management: pain level controlled Vital Signs Assessment: post-procedure vital signs reviewed and stable Respiratory status: spontaneous breathing, nonlabored ventilation, respiratory function stable and patient connected to nasal cannula oxygen Cardiovascular status: blood pressure returned to baseline and stable Postop Assessment: no apparent nausea or vomiting Anesthetic complications: no   No notable events documented.   Last Vitals:  Vitals:   01/15/23 1130 01/15/23 1149  BP: 100/65 101/69  Pulse: 75 65  Resp: 20 18  Temp: 36.8 C 36.6 C  SpO2: 100% 100%    Last Pain:  Vitals:   01/15/23 1149  TempSrc: Oral  PainSc: 0-No pain                 Lenard Simmer

## 2023-01-20 NOTE — Telephone Encounter (Signed)
Patient stated that she had SX last week and was told there would be an order placed for her to obtain a knee scooter. She has not heard anything back from anyone in regards on how to go about getting one. Can you please call and advise  Thanks

## 2023-01-21 ENCOUNTER — Ambulatory Visit (INDEPENDENT_AMBULATORY_CARE_PROVIDER_SITE_OTHER): Payer: BC Managed Care – PPO

## 2023-01-21 ENCOUNTER — Ambulatory Visit (INDEPENDENT_AMBULATORY_CARE_PROVIDER_SITE_OTHER): Payer: BC Managed Care – PPO | Admitting: Podiatry

## 2023-01-21 ENCOUNTER — Encounter: Payer: Self-pay | Admitting: Podiatry

## 2023-01-21 DIAGNOSIS — M779 Enthesopathy, unspecified: Secondary | ICD-10-CM

## 2023-01-21 DIAGNOSIS — M9689 Other intraoperative and postprocedural complications and disorders of the musculoskeletal system: Secondary | ICD-10-CM

## 2023-01-21 DIAGNOSIS — M2011 Hallux valgus (acquired), right foot: Secondary | ICD-10-CM

## 2023-01-21 DIAGNOSIS — M21611 Bunion of right foot: Secondary | ICD-10-CM

## 2023-01-21 DIAGNOSIS — M2041 Other hammer toe(s) (acquired), right foot: Secondary | ICD-10-CM

## 2023-01-21 NOTE — Progress Notes (Signed)
  Subjective:  Patient ID: Penny Nash, female    DOB: [birth year],  MRN: [id number]  Chief Complaint  Patient presents with   Routine Post Op    PATIENT STATES "SO FAR SO GOOD SINCE LAST VISIT , PATIENT STATES SHE HAS MEDICATION FOR PAIN BUT SHE HASN'T BEEN TAKING IT BECAUSE IT MAKES HER FEEL SICK. BUT PATIENT TAKES TYLENOL FOR PAIN.    DOS: 01/15/2023 Procedure: Right first MTP arthrodesis, metatarsal head resections and hammertoe corrections  52 y.o. female returns for post-op check.   Review of Systems: Negative except as noted in the HPI. Denies N/V/F/Ch.   Objective:  There were no vitals filed for this visit. There is no height or weight on file to calculate BMI. Constitutional Well developed. Well nourished.  Vascular Foot warm and well perfused. Capillary refill normal to all digits.  Calf is soft and supple, no posterior calf or knee pain, negative Homans' sign  Neurologic Normal speech. Oriented to person, place, and time. Epicritic sensation to light touch grossly present bilaterally.  Dermatologic Skin healing well without signs of infection. Skin edges well coapted without signs of infection.  Orthopedic: Tenderness to palpation noted about the surgical site.  Moderate edema   Multiple view plain film radiographs: Good correction noted, no complication of hardware Assessment:   1. Hammertoe of right foot   2. Hallux valgus with bunions, right   3. 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  S/p foot surgery right -Progressing as expected post-operatively. -XR: No complication noted -WB Status: Nonweightbearing in CAM boot with knee scooter and/or walker -Sutures: Return in 2 weeks to remove. -Medications: No refills required -Foot redressed.  Return in about 2 weeks (around 02/04/2023) for suture removal.

## 2023-01-22 ENCOUNTER — Encounter: Payer: Self-pay | Admitting: Podiatry

## 2023-01-22 ENCOUNTER — Telehealth: Payer: Self-pay | Admitting: Podiatry

## 2023-01-22 NOTE — Telephone Encounter (Signed)
Received message from Dr. Lilian Kapur requesting this patient be released to return to work from home starting 01/27/2023 -- she still cannot go into the office until 8 weeks out (03/24/2023).  Called patient and advised same.  Also e-mailed a copy of the doctors note to the patient [email or web address] and to [facility] Rowdy/HR Coordinator [email or web address]    .Marland Kitchen...   J. Abbott -- 01/22/2023

## 2023-01-23 ENCOUNTER — Encounter: Payer: BC Managed Care – PPO | Admitting: Podiatry

## 2023-01-28 ENCOUNTER — Encounter: Payer: Self-pay | Admitting: Podiatry

## 2023-02-04 ENCOUNTER — Ambulatory Visit (INDEPENDENT_AMBULATORY_CARE_PROVIDER_SITE_OTHER): Payer: BC Managed Care – PPO | Admitting: Podiatry

## 2023-02-04 ENCOUNTER — Encounter: Payer: Self-pay | Admitting: Podiatry

## 2023-02-04 DIAGNOSIS — M9689 Other intraoperative and postprocedural complications and disorders of the musculoskeletal system: Secondary | ICD-10-CM

## 2023-02-04 DIAGNOSIS — M2011 Hallux valgus (acquired), right foot: Secondary | ICD-10-CM

## 2023-02-04 DIAGNOSIS — M21611 Bunion of right foot: Secondary | ICD-10-CM

## 2023-02-04 DIAGNOSIS — M2041 Other hammer toe(s) (acquired), right foot: Secondary | ICD-10-CM

## 2023-02-04 NOTE — Progress Notes (Signed)
  Subjective:  Patient ID: Penny Nash, female    DOB: Jul 04, 1970,  MRN: [id number]  Chief Complaint  Patient presents with   Routine Post Op    POV # 2 DOS 01/15/23 --- RIGHT FOOT GREAT TOE FUSION, SHAVING OF BONE BIG TOE, HAMMERTOE CORRECTION 2-5, METATARSAL HEAD RESECTION 2-5, POSSIBLE BONE GRAFT FROM HEEL    DOS: 01/15/2023 Procedure: Right first MTP arthrodesis, metatarsal head resections and hammertoe corrections  52 y.o. female returns for post-op check.   Review of Systems: Negative except as noted in the HPI. Denies N/V/F/Ch.   Objective:  There were no vitals filed for this visit. There is no height or weight on file to calculate BMI. Constitutional Well developed. Well nourished.  Vascular Foot warm and well perfused. Capillary refill normal to all digits.  Calf is soft and supple, no posterior calf or knee pain, negative Homans' sign  Neurologic Normal speech. Oriented to person, place, and time. Epicritic sensation to light touch grossly present bilaterally.  Dermatologic Incisions are well-healed there is some interdigital maceration  Orthopedic: Tenderness to palpation noted about the surgical site.  Mild edema   Multiple view plain film radiographs: Good correction noted, no complication of hardware Assessment:   1. Hallux valgus with bunions, right   2. Hammertoe of right foot   3. 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  S/p foot surgery right -Progressing as expected post-operatively. -XR: No complication noted -WB Status: Can begin gradual heel weightbearing in CAM boot with knee scooter and/or walker -Sutures: Removed today.  She may resume regular bathing.  Before and after bathing I recommended that she apply Betadine in between the toes to alleviate the maceration that is currently developing.  Return in about 3 weeks (around 02/25/2023) for post op (new x-rays).

## 2023-02-25 ENCOUNTER — Ambulatory Visit (INDEPENDENT_AMBULATORY_CARE_PROVIDER_SITE_OTHER): Payer: 59 | Admitting: Podiatry

## 2023-02-25 ENCOUNTER — Encounter: Payer: Self-pay | Admitting: Podiatry

## 2023-02-25 ENCOUNTER — Ambulatory Visit (INDEPENDENT_AMBULATORY_CARE_PROVIDER_SITE_OTHER): Payer: 59

## 2023-02-25 VITALS — Ht <= 58 in | Wt 112.0 lb

## 2023-02-25 DIAGNOSIS — M21611 Bunion of right foot: Secondary | ICD-10-CM | POA: Diagnosis not present

## 2023-02-25 DIAGNOSIS — M2011 Hallux valgus (acquired), right foot: Secondary | ICD-10-CM

## 2023-02-25 DIAGNOSIS — M2041 Other hammer toe(s) (acquired), right foot: Secondary | ICD-10-CM

## 2023-02-25 NOTE — Progress Notes (Signed)
  Subjective:  Patient ID: Penny Nash, female    DOB: [birth year],  MRN: [id number]  Chief Complaint  Patient presents with   Routine Post Op    POV # 3 DOS 01/15/23 --- RIGHT FOOT GREAT TOE FUSION, SHAVING OF BONE BIG TOE, HAMMERTOE CORRECTION 2-5, METATARSAL HEAD RESECTION 2-5, POSSIBLE BONE GRAFT FROM HEEL    DOS: 01/15/2023 Procedure: Right first MTP arthrodesis, metatarsal head resections and hammertoe corrections  53 y.o. female returns for post-op check.  She is not have any pain  Review of Systems: Negative except as noted in the HPI. Denies N/V/F/Ch.   Objective:  There were no vitals filed for this visit. Body mass index is 24.24 kg/m. Constitutional Well developed. Well nourished.  Vascular Foot warm and well perfused. Capillary refill normal to all digits.  Calf is soft and supple, no posterior calf or knee pain, negative Homans' sign  Neurologic Normal speech. Oriented to person, place, and time. Epicritic sensation to light touch grossly present bilaterally.  Dermatologic Incisions are well-healed   Orthopedic: No pain minimal edema   Multiple view plain film radiographs: Has maintained no complication of hardware good fusion across the arthrodesis site of the first MTP some of the digital fusion sites have partial fusion Assessment:   1. Hallux valgus with bunions, right   2. Hammertoe of right foot    Plan:  Patient was evaluated and treated and all questions answered.  S/p foot surgery right -X-ray results reviewed with patient we discussed there is good consolidation across the arthrodesis site that the hammertoe sites have delayed fusion, these may progress to full fusion or may develop asymptomatic nonunion.  We discussed that is rare that nonunion of toe arthrodesis sites are symptomatic and require surgical revision.  Kirschner wires were removed uneventfully today.  She may gradually return to regular shoe gear over the next 2 weeks.  May return  to work without restrictions if she is able to do this after 2 weeks.  Follow-up with me in 6 weeks for new radiographs.  Return in about 6 weeks (around 04/08/2023) for post op (new x-rays).

## 2023-03-13 ENCOUNTER — Ambulatory Visit: Payer: BC Managed Care – PPO | Admitting: Cardiology

## 2023-03-17 ENCOUNTER — Ambulatory Visit: Payer: Self-pay | Admitting: Cardiology

## 2023-03-26 ENCOUNTER — Other Ambulatory Visit: Payer: Self-pay | Admitting: Interventional Cardiology

## 2023-03-29 ENCOUNTER — Other Ambulatory Visit: Payer: Self-pay | Admitting: Interventional Cardiology

## 2023-04-03 ENCOUNTER — Ambulatory Visit (INDEPENDENT_AMBULATORY_CARE_PROVIDER_SITE_OTHER): Payer: 59

## 2023-04-03 ENCOUNTER — Ambulatory Visit: Payer: 59 | Admitting: Podiatry

## 2023-04-03 DIAGNOSIS — M2011 Hallux valgus (acquired), right foot: Secondary | ICD-10-CM

## 2023-04-03 DIAGNOSIS — M9689 Other intraoperative and postprocedural complications and disorders of the musculoskeletal system: Secondary | ICD-10-CM

## 2023-04-03 DIAGNOSIS — M21611 Bunion of right foot: Secondary | ICD-10-CM

## 2023-04-03 DIAGNOSIS — M2041 Other hammer toe(s) (acquired), right foot: Secondary | ICD-10-CM

## 2023-04-03 NOTE — Progress Notes (Signed)
  Subjective:  Patient ID: Penny Nash, female    DOB: 1970/08/20,  MRN: [id number]  Chief Complaint  Patient presents with   Hallux valgus with bunions, right    Follow up for  right Hallux valgus with bunions, pt stated that she is doing okay she has no pain but still has some swelling    DOS: 01/15/2023 Procedure: Right first MTP arthrodesis, metatarsal head resections and hammertoe corrections  53 y.o. female returns for post-op check.  She is not have any pain  Review of Systems: Negative except as noted in the HPI. Denies N/V/F/Ch.   Objective:  There were no vitals filed for this visit. There is no height or weight on file to calculate BMI. Constitutional Well developed. Well nourished.  Vascular Foot warm and well perfused. Capillary refill normal to all digits.  Calf is soft and supple, no posterior calf or knee pain, negative Homans' sign  Neurologic Normal speech. Oriented to person, place, and time. Epicritic sensation to light touch grossly present bilaterally.  Dermatologic Incisions are well-healed   Orthopedic: No pain minimal edema   Multiple view plain film radiographs: Correction is maintained has excellent correction compared to preoperative films.  There is some interval bony bridging at the fourth PIPJ fusion site. Assessment:   1. Hallux valgus with bunions, right   2. Hammertoe of right foot   3. 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  S/p foot surgery right -Doing very well.  Clinically very pleased with radiographic and clinical appearance of the foot.  She may continue shoe gear and activity as tolerated I have no restrictions for her.  Follow-up with me as needed if there are any issues if she has persistent stiffness or gait dysfunction physical therapy may beneficial and she will let me know if she would like to proceed with this at any point.  No follow-ups on file.

## 2023-04-04 ENCOUNTER — Encounter: Payer: Self-pay | Admitting: Podiatry

## 2023-04-15 ENCOUNTER — Encounter: Payer: Self-pay | Admitting: Cardiology

## 2023-04-15 ENCOUNTER — Ambulatory Visit: Payer: 59 | Attending: Cardiology | Admitting: Cardiology

## 2023-04-15 VITALS — BP 108/70 | HR 55 | Resp 16 | Ht <= 58 in | Wt 115.2 lb

## 2023-04-15 DIAGNOSIS — I2510 Atherosclerotic heart disease of native coronary artery without angina pectoris: Secondary | ICD-10-CM

## 2023-04-15 DIAGNOSIS — I255 Ischemic cardiomyopathy: Secondary | ICD-10-CM | POA: Diagnosis not present

## 2023-04-15 DIAGNOSIS — I252 Old myocardial infarction: Secondary | ICD-10-CM | POA: Diagnosis not present

## 2023-04-15 DIAGNOSIS — I10 Essential (primary) hypertension: Secondary | ICD-10-CM

## 2023-04-15 DIAGNOSIS — E782 Mixed hyperlipidemia: Secondary | ICD-10-CM

## 2023-04-15 DIAGNOSIS — Z955 Presence of coronary angioplasty implant and graft: Secondary | ICD-10-CM

## 2023-04-15 MED ORDER — EZETIMIBE 10 MG PO TABS: 10.0000 mg | ORAL_TABLET | Freq: Every day | ORAL | 3 refills | Status: DC

## 2023-04-15 NOTE — Progress Notes (Signed)
 Cardiology Office Note:  .   Date:  04/15/2023  ID:  Penny Nash, DOB 1970-11-07, MRN [id number] PCP:  Macy Mis, MD  Former Cardiology Providers: Dr. Lance Muss [facility] Providers Cardiologist:  Tessa Lerner, DO , [facility] (established care 04/15/23) Electrophysiologist:  None  Click to update primary MD,subspecialty MD or APP then REFRESH:1}    Chief Complaint  Patient presents with   Coronary Artery Disease   Follow-up    History of Present Illness: .   Penny Nash is a 53 y.o.  female whose past medical history and cardiovascular risk factors includes: Turner syndrome, ICM, CAD with prior anterior MI 01/2020 with 100% stenosis mid LAD treated with IVUS PTCA and DES chronic systolic heart failure with EF 40-45% 01/2020 s/p MI, elevated LFT, fatty liver,  hyperlipidemia   Formally under the care of Dr. Lance Muss who last saw Penny Nash back in January 2024. I am seeing her for the first time to re-establishing care.   In December 2021 patient presented with anterior myocardial infarction.  Mid LAD lesion was 100% aspiration thrombectomy IVUS/PTCA and DES.  Ostial/proximal LAD had stenosis underwent DES.  Patient denies anginal chest pain or heart failure symptoms.  Overall functional capacity remains relatively stable, ambulating 8,000-10,000 steps per day.  Review of Systems: .   Review of Systems  Cardiovascular:  Negative for chest pain, claudication, irregular heartbeat, leg swelling, near-syncope, orthopnea, palpitations, paroxysmal nocturnal dyspnea and syncope.  Respiratory:  Negative for shortness of breath.   Hematologic/Lymphatic: Negative for bleeding problem.    Studies Reviewed:   EKG: EKG Interpretation Date/Time:  Tuesday April 15 2023 15:51:46 EST Ventricular Rate:  56 PR Interval:  138 QRS Duration:  84 QT Interval:  432 QTC Calculation: 416 R Axis:   80  Text Interpretation: Sinus bradycardia Low  voltage QRS Cannot rule out Anteroseptal infarct (cited on or before 11-Feb-2020) When compared with ECG of 10-Jan-2023 11:23, Nonspecific T wave abnormality no longer evident in Inferior leads Nonspecific T wave abnormality no longer evident in Lateral leads Confirmed by Tessa Lerner ([id number]) on 04/15/2023 4:16:43 PM  Echocardiogram: December 2021:  1. There is severe hypokinesis of the mid anterior wall/septum into the  apex and around the apical septum consistent with LAD infarction. Left  ventricular ejection fraction, by estimation, is 40 to 45%. Left  ventricular ejection fraction by 2D MOD  biplane is 44.3 %. The left ventricle has mildly decreased function. The  left ventricle demonstrates regional wall motion abnormalities (see  scoring diagram/findings for description). Left ventricular diastolic  parameters were normal.   2. Right ventricular systolic function is normal. The right ventricular  size is normal. Tricuspid regurgitation signal is inadequate for assessing  PA pressure.   3. The mitral valve is grossly normal. Trivial mitral valve  regurgitation. No evidence of mitral stenosis.   4. The aortic valve is tricuspid. Aortic valve regurgitation is not  visualized. No aortic stenosis is present.   5. The inferior vena cava is normal in size with greater than 50%  respiratory variability, suggesting right atrial pressure of 3 mmHg.   Conclusion(s)/Recommendation(s): Findings consistent with ischemic  cardiomyopathy.    RADIOLOGY: NA  Labs:       Latest Ref Rng & Units 02/14/2020   12:48 AM 02/13/2020    1:05 AM 02/12/2020    3:28 AM  CBC  WBC 4.0 - 10.5 K/uL 13.4  11.8  13.3   Hemoglobin 12.0 - 15.0  g/dL 78.2  95.6  21.3   Hematocrit 36.0 - 46.0 % 38.9  37.1  38.8   Platelets 150 - 400 K/uL 175  209  200        Latest Ref Rng & Units 02/14/2020   12:48 AM 02/13/2020    7:27 AM 02/12/2020    8:32 AM  BMP  Glucose 70 - 99 mg/dL 91  086  578   BUN 6 - 20  mg/dL 16  14  12    Creatinine 0.44 - 1.00 mg/dL 4.69  6.29  5.28   Sodium 135 - 145 mmol/L 137  136  136   Potassium 3.5 - 5.1 mmol/L 3.6  4.0  3.6   Chloride 98 - 111 mmol/L 104  102  101   CO2 22 - 32 mmol/L 22  24  23    Calcium 8.9 - 10.3 mg/dL 9.1  8.9  8.9       Latest Ref Rng & Units 02/14/2020   12:48 AM 02/13/2020    7:27 AM 02/12/2020    8:32 AM  CMP  Glucose 70 - 99 mg/dL 91  413  244   BUN 6 - 20 mg/dL 16  14  12    Creatinine 0.44 - 1.00 mg/dL 0.10  2.72  5.36   Sodium 135 - 145 mmol/L 137  136  136   Potassium 3.5 - 5.1 mmol/L 3.6  4.0  3.6   Chloride 98 - 111 mmol/L 104  102  101   CO2 22 - 32 mmol/L 22  24  23    Calcium 8.9 - 10.3 mg/dL 9.1  8.9  8.9     Lab Results  Component Value Date   CHOL 162 02/12/2020   HDL 55 02/12/2020   LDLCALC 94 02/12/2020   TRIG 65 02/12/2020   CHOLHDL 2.9 02/12/2020   No results for input(s): "LIPOA" in the last 8760 hours. No components found for: "NTPROBNP" No results for input(s): "PROBNP" in the last 8760 hours. No results for input(s): "TSH" in the last 8760 hours.  External Labs: Collected: October 28, 2022 Care Everywhere.  [facility]. Total cholesterol 137, triglycerides 44, HDL 64, LDL 63. Hemoglobin A1c 5.6. BUN 10, creatinine 0.78. eGFR 91. Sodium 139, potassium 4, chloride 103, bicarb 24. AST 42, ALT 42 (above normal limits), alkaline phosphatase within normal limits  Physical Exam:    Today's Vitals   04/15/23 1547  BP: 108/70  Pulse: (!) 55  Resp: 16  SpO2: 93%  Weight: 115 lb 3.2 oz (52.3 kg)  Height: 4\' 9"  (1.448 m)   Body mass index is 24.93 kg/m. Wt Readings from Last 3 Encounters:  04/15/23 115 lb 3.2 oz (52.3 kg)  02/25/23 112 lb (50.8 kg)  01/15/23 112 lb (50.8 kg)    Physical Exam  Constitutional: No distress.  hemodynamically stable  Neck: No JVD present.  Cardiovascular: Normal rate, regular rhythm, S1 normal and S2 normal. Exam reveals no gallop, no S3 and no S4.  No murmur  heard. Pulmonary/Chest: Effort normal and breath sounds normal. No stridor. She has no wheezes. She has no rales.  Musculoskeletal:        General: No edema.     Cervical back: Neck supple.  Skin: Skin is warm.     Impression & Recommendation(s):  Impression:   ICD-10-CM   1. Coronary artery disease involving native coronary artery of native heart without angina pectoris  I25.10 EKG 12-Lead    ezetimibe (ZETIA) 10 MG  tablet    LDL cholesterol, direct    Lipid panel    Comprehensive metabolic panel    ECHOCARDIOGRAM COMPLETE    Comprehensive metabolic panel    Lipid panel    LDL cholesterol, direct    2. Hx of myocardial infarction  I25.2     3. S/P primary angioplasty with coronary stent  Z95.5     4. Ischemic cardiomyopathy  I25.5     5. Mixed hyperlipidemia  E78.2     6. Benign hypertension  I10        Recommendation(s):  Coronary artery disease involving native coronary artery of native heart without angina pectoris Hx of myocardial infarction S/P primary angioplasty with coronary stent Index event 2021 Underwent PCI to the LAD proximal and mid. Denies anginal chest pain. No use of sublingual nitroglycerin tablets since the last office visit. EKG today illustrates sinus rhythm without injury pattern. Echo will be ordered to evaluate for structural heart disease and left ventricular systolic function. Reemphasized the importance of secondary prevention with focus on improving her modifiable cardiovascular risk factors such as glycemic control, lipid management.  Ischemic cardiomyopathy As per the echocardiogram from December 2021 LVEF 40-45 percent. Currently on lisinopril 2.5 mg p.o. directed to take in the evening. Continue Toprol-XL 12.5 mg p.o. every morning Unable to uptitrate GDMT due to soft blood pressures. Echo will be ordered to evaluate for structural heart disease and left ventricular systolic function.  Mixed hyperlipidemia Outside labs from [facility] September 2024 independently reviewed. LDL is within acceptable limits. Given her history of myocardial infarction requiring PCI intervention recommend a goal LDL <55 mg/dL Continue rosuvastatin 80 mg p.o. every evening. Start Zetia 10 mg p.o. daily. Fasting lipids in 6 weeks to reevaluate therapy  Benign hypertension Office blood pressures are soft. Recommended that she takes Toprol-XL in the morning and lisinopril at night. Asymptomatic. Monitor for now   Orders Placed:  Orders Placed This Encounter  Procedures   LDL cholesterol, direct    Standing Status:   Future    Number of Occurrences:   1    Expected Date:   05/27/2023    Expiration Date:   04/14/2024   Lipid panel    Standing Status:   Future    Number of Occurrences:   1    Expected Date:   05/27/2023    Expiration Date:   04/14/2024   Comprehensive metabolic panel    Standing Status:   Future    Number of Occurrences:   1    Expected Date:   05/27/2023    Expiration Date:   04/14/2024   EKG 12-Lead   ECHOCARDIOGRAM COMPLETE    Standing Status:   Future    Expected Date:   04/22/2023    Expiration Date:   04/14/2024    Where should this test be performed:   [facility])    Does the patient weigh less than or greater than 250 lbs?:   Patient weighs less than 250 lbs    Perflutren DEFINITY (image enhancing agent) should be administered unless hypersensitivity or allergy exist:   Administer Perflutren    Reason for exam-Echo:   Other-Full Diagnosis List    Full ICD-10/Reason for Exam:   CAD (coronary artery disease) [161096]   Final Medication List:    Meds ordered this encounter  Medications   ezetimibe (ZETIA) 10 MG tablet    Sig: Take 1 tablet (10 mg total) by mouth daily.  Dispense:  30 tablet    Refill:  3    There are no discontinued medications.   Current Outpatient Medications:    alendronate (FOSAMAX) 70 MG tablet, Take 70 mg by mouth every Wednesday., Disp: , Rfl:    aspirin  EC 81 MG tablet, Take 81 mg by mouth daily. Swallow whole., Disp: , Rfl:    atorvastatin (LIPITOR) 80 MG tablet, TAKE 1 TABLET BY MOUTH EVERY DAY, Disp: 30 tablet, Rfl: 0   CALCIUM PO, Take 1 tablet by mouth daily., Disp: , Rfl:    clopidogrel (PLAVIX) 75 MG tablet, TAKE 1 TABLET BY MOUTH EVERY DAY, Disp: 30 tablet, Rfl: 0   ezetimibe (ZETIA) 10 MG tablet, Take 1 tablet (10 mg total) by mouth daily., Disp: 30 tablet, Rfl: 3   lisinopril (ZESTRIL) 2.5 MG tablet, TAKE 1 TABLET BY MOUTH EVERY DAY, Disp: 30 tablet, Rfl: 0   metoprolol succinate (TOPROL-XL) 25 MG 24 hr tablet, TAKE 1/2 TABLET BY MOUTH DAILY, Disp: 45 tablet, Rfl: 3   nitroGLYCERIN (NITROSTAT) 0.4 MG SL tablet, Place 1 tablet (0.4 mg total) under the tongue every 5 (five) minutes as needed for chest pain., Disp: 25 tablet, Rfl: 3   tretinoin (RETIN-A) 0.05 % cream, Apply 1 Application topically at bedtime., Disp: , Rfl:    VITAMIN D PO, Take 1 capsule by mouth daily., Disp: , Rfl:    gabapentin (NEURONTIN) 300 MG capsule, Take 1 capsule (300 mg total) by mouth 3 (three) times daily for 7 days. (Patient not taking: Reported on 04/15/2023), Disp: 21 capsule, Rfl: 0  Consent:   NA  Disposition:    1 year follow-up sooner if needed  Patient may be asked to follow-up sooner based on the results of the above-mentioned testing.  Her questions and concerns were addressed to her satisfaction. She voices understanding of the recommendations provided during this encounter.    Signed, Tessa Lerner, DO, [facility]  [facility]  [address] [place], [place] [address] 04/15/2023 5:33 PM

## 2023-04-15 NOTE — Patient Instructions (Signed)
 Medication Instructions:  Your physician has recommended you make the following change in your medication:   START Ezetimibe (Zetia) 10 mg    Take Atorvastatin and Lisinopril in the evening.  *If you need a refill on your cardiac medications before your next appointment, please call your pharmacy*  Lab Work: To be completed in 6 weeks: FASTING lipid panel, direct LDL, CMP  If you have labs (blood work) drawn today and your tests are completely normal, you will receive your results only by: MyChart Message (if you have MyChart) OR A paper copy in the mail If you have any lab test that is abnormal or we need to change your treatment, we will call you to review the results.  Testing/Procedures: Your physician has requested that you have an echocardiogram. Echocardiography is a painless test that uses sound waves to create images of your heart. It provides your doctor with information about the size and shape of your heart and how well your heart's chambers and valves are working. This procedure takes approximately one hour. There are no restrictions for this procedure. Please do NOT wear cologne, perfume, aftershave, or lotions (deodorant is allowed). Please arrive 15 minutes prior to your appointment time.  Please note: We ask at that you not bring children with you during ultrasound (echo/ vascular) testing. Due to room size and safety concerns, children are not allowed in the ultrasound rooms during exams. Our front office staff cannot provide observation of children in our lobby area while testing is being conducted. An adult accompanying a patient to their appointment will only be allowed in the ultrasound room at the discretion of the ultrasound technician under special circumstances. We apologize for any inconvenience.   Follow-Up: At [facility], you and your health needs are our priority.  As part of our continuing mission to provide you with exceptional heart care, we have created  designated Provider Care Teams.  These Care Teams include your primary Cardiologist (physician) and Advanced Practice Providers (APPs -  Physician Assistants and Nurse Practitioners) who all work together to provide you with the care you need, when you need it.  We recommend signing up for the patient portal called "MyChart".  Sign up information is provided on this After Visit Summary.  MyChart is used to connect with patients for Virtual Visits (Telemedicine).  Patients are able to view lab/test results, encounter notes, upcoming appointments, etc.  Non-urgent messages can be sent to your provider as well.   To learn more about what you can do with MyChart, go to [email or web address].    Your next appointment:   1 year(s)  The format for your next appointment:   In Person  Provider:   Tessa Lerner, DO {

## 2023-04-16 ENCOUNTER — Other Ambulatory Visit: Payer: Self-pay | Admitting: Cardiology

## 2023-04-16 DIAGNOSIS — I2510 Atherosclerotic heart disease of native coronary artery without angina pectoris: Secondary | ICD-10-CM

## 2023-04-18 ENCOUNTER — Other Ambulatory Visit: Payer: Self-pay | Admitting: Interventional Cardiology

## 2023-04-22 ENCOUNTER — Other Ambulatory Visit: Payer: Self-pay | Admitting: Interventional Cardiology

## 2023-05-19 ENCOUNTER — Other Ambulatory Visit (HOSPITAL_COMMUNITY): Payer: 59

## 2023-05-21 ENCOUNTER — Ambulatory Visit (HOSPITAL_COMMUNITY): Payer: 59 | Attending: Cardiology

## 2023-05-21 ENCOUNTER — Encounter: Payer: Self-pay | Admitting: Cardiology

## 2023-05-21 DIAGNOSIS — I2510 Atherosclerotic heart disease of native coronary artery without angina pectoris: Secondary | ICD-10-CM | POA: Insufficient documentation

## 2023-05-21 DIAGNOSIS — I361 Nonrheumatic tricuspid (valve) insufficiency: Secondary | ICD-10-CM

## 2023-05-21 LAB — ECHOCARDIOGRAM COMPLETE
Area-P 1/2: 2.59 cm2
S' Lateral: 2.4 cm

## 2023-05-21 MED ORDER — PERFLUTREN LIPID MICROSPHERE
1.0000 mL | INTRAVENOUS | Status: AC | PRN
  Administered 2023-05-21: 2 mL via INTRAVENOUS

## 2023-07-30 ENCOUNTER — Encounter: Payer: Self-pay | Admitting: Emergency Medicine

## 2023-07-30 ENCOUNTER — Ambulatory Visit
Admission: EM | Admit: 2023-07-30 | Discharge: 2023-07-30 | Disposition: A | Discharge status: Home / Self Care | Attending: Family Medicine | Admitting: Family Medicine

## 2023-07-30 DIAGNOSIS — M79631 Pain in right forearm: Secondary | ICD-10-CM

## 2023-07-30 MED ORDER — ASPIRIN 81 MG PO CHEW
243.0000 mg | CHEWABLE_TABLET | Freq: Once | ORAL | Status: AC
  Administered 2023-07-30: 243 mg via ORAL

## 2023-07-30 NOTE — ED Triage Notes (Signed)
 Pt c/o right arm pain burning pain that comes and goes for 1 week. States she had heart attack in 2021

## 2023-07-30 NOTE — Discharge Instructions (Addendum)
  1. Right forearm pain (Primary) - ED EKG performed in UC shows sinus bradycardia, ventricular rate 58 bpm, no STEMI - aspirin  chewable tablet 243 mg given in UC for past history of MI right arm discomfort x 1 week.  Patient has already taken 181 mg aspirin  plus morning -Tried taking 400 to 600 mg of ibuprofen 2-3 times daily as needed for pain and right forearm to see if symptoms improve. - Based on past history of of MI with cardiac stent placed recommend follow-up with cardiology for further evaluation management.  -Based on the modified HEART score of 3 indicating a 0.9 to 1.7% risk of MACE, minimal concern for acute cardiac syndrome as cause of right arm pain. - If you develop any abnormal symptoms such as severe right shoulder/arm pain, chest pain, shortness of breath, dizziness, weakness, diaphoresis follow-up in ER immediately for further evaluation and treatment.

## 2023-07-30 NOTE — ED Provider Notes (Signed)
 [facility]  Note:  This document was prepared using Dragon voice recognition software and may include unintentional dictation errors.  MRN: [id number] DOB: Dec 17, 1970  Subjective:   Penny Nash is a 53 y.o. female presenting for right forearm and posterior scapular pain x 1 week.  Patient denies any known injury or trauma to the area.  Patient has a past history of ACS with placement which concerned she had right arm pain not subsiding.  Patient denies any chest pain, shortness of breath, weakness, dizziness.  Patient thought that maybe taking Fosamax was causing her to have some musculoskeletal pain.  Patient has not taken any over-the-counter pain medication to help with symptoms.  Patient denies any other secondary medical symptoms.  Patient reports that she did take an 81 mg aspirin  this morning as directed by her physician.  No current facility-administered medications for this encounter.  Current Outpatient Medications:    alendronate (FOSAMAX) 70 MG tablet, Take 70 mg by mouth every Wednesday., Disp: , Rfl:    aspirin  EC 81 MG tablet, Take 81 mg by mouth daily. Swallow whole., Disp: , Rfl:    atorvastatin  (LIPITOR ) 80 MG tablet, TAKE 1 TABLET BY MOUTH EVERY DAY, Disp: 90 tablet, Rfl: 3   CALCIUM  PO, Take 1 tablet by mouth daily., Disp: , Rfl:    clopidogrel  (PLAVIX ) 75 MG tablet, TAKE 1 TABLET BY MOUTH EVERY DAY, Disp: 90 tablet, Rfl: 3   ezetimibe  (ZETIA ) 10 MG tablet, TAKE 1 TABLET BY MOUTH EVERY DAY, Disp: 90 tablet, Rfl: 3   gabapentin  (NEURONTIN ) 300 MG capsule, Take 1 capsule (300 mg total) by mouth 3 (three) times daily for 7 days. (Patient not taking: Reported on 04/15/2023), Disp: 21 capsule, Rfl: 0   lisinopril  (ZESTRIL ) 2.5 MG tablet, TAKE 1 TABLET BY MOUTH EVERY DAY, Disp: 90 tablet, Rfl: 3   metoprolol  succinate (TOPROL -XL) 25 MG 24 hr tablet, TAKE 1/2 TABLET BY MOUTH DAILY, Disp: 45 tablet, Rfl: 3   nitroGLYCERIN  (NITROSTAT ) 0.4 MG SL tablet,  Place 1 tablet (0.4 mg total) under the tongue every 5 (five) minutes as needed for chest pain., Disp: 25 tablet, Rfl: 3   tretinoin (RETIN-A) 0.05 % cream, Apply 1 Application topically at bedtime., Disp: , Rfl:    VITAMIN D PO, Take 1 capsule by mouth daily., Disp: , Rfl:    No Known Allergies  Past Medical History:  Diagnosis Date   Age-related osteoporosis without current pathological fracture    Bunion    Cholelithiases    Chronic systolic heart failure (HCC)    a.) TTE 02/12/2020: EF 40-45%, mid anterior/septal wall with sever HK, norm RVSF, triv MR   Coronary artery disease    a.) LHC/PCI 02/11/2020: 75% pLAD (3.5 x 12 mm Synergy DES), 100% mLAD (asp thrombectomy + IVUS + PTCA+ 2.25 x 24 Synergy DES), 50% m-dLAD   Elevated LFTs    Hallux valgus with bunions, right    Hammertoe of right foot    Hepatic steatosis    Hip fracture (HCC) 2002   HTN (hypertension)    Hyperlipidemia with target LDL less than 70    Ischemic cardiomyopathy 01/2020   a.) LHC 02/11/2020: EF 45-50%; b.) TTE 02/12/2020: EF 40-45%   Long term current use of aspirin     Low bone density    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    On chronic clopidogrel  therapy    Osteoporosis    Prediabetes    Scarlet fever    ST  elevation myocardial infarction (STEMI) of anterior wall (HCC) 02/11/2020   a.) LHC 02/11/2023: 75% pLAD (3.5 x 12 mm Synergy DES), 100% mLAD (culprit lesion --> asp thrombectomy + IVUS + PTCA + 2.25 x 24 mm Synergy DES), 50% m-dLAD   Turner syndrome    a.) confirmed tricuspid AoV 02/12/2020     Past Surgical History:  Procedure Laterality Date   BREAST REDUCTION SURGERY Bilateral 2001 est   CORONARY ULTRASOUND/IVUS N/A 02/11/2020   Procedure: Intravascular Ultrasound/IVUS;  Surgeon: Lucendia Rusk, MD;  Location: [facility] INVASIVE CV LAB;  Service: Cardiovascular;  Laterality: N/A;   CORONARY/GRAFT ACUTE MI REVASCULARIZATION N/A 02/11/2020   Procedure: Coronary/Graft Acute MI Revascularization;   Surgeon: Lucendia Rusk, MD;  Location: [facility] INVASIVE CV LAB;  Service: Cardiovascular;  Laterality: N/A;   FOOT ARTHRODESIS Right 01/15/2023   Procedure: BONE GRAFT;  Surgeon: Floyce Hutching, DPM;  Location: [facility] ORS;  Service: Orthopedics/Podiatry;  Laterality: Right;   HALLUX FUSION Right 01/15/2023   Procedure: HALLUX MPJ FUSION;  Surgeon: Floyce Hutching, DPM;  Location: [facility] ORS;  Service: Orthopedics/Podiatry;  Laterality: Right;   HAMMER TOE SURGERY Right 01/15/2023   Procedure: HAMMER TOE CORRECTION 4 TOES RIGHT FOOT;  Surgeon: Floyce Hutching, DPM;  Location: [facility] ORS;  Service: Orthopedics/Podiatry;  Laterality: Right;   HIP FRACTURE SURGERY Left 2003 est   LEFT HEART CATH AND CORONARY ANGIOGRAPHY N/A 02/11/2020   Procedure: LEFT HEART CATH AND CORONARY ANGIOGRAPHY;  Surgeon: Lucendia Rusk, MD;  Location: [facility] INVASIVE CV LAB;  Service: Cardiovascular;  Laterality: N/A;   METATARSAL HEAD EXCISION Right 01/15/2023   Procedure: METARSAL HEAD RESECTION 5TH TOE RIGHT FOOT;  Surgeon: Floyce Hutching, DPM;  Location: [facility] ORS;  Service: Orthopedics/Podiatry;  Laterality: Right;   OSTECTOMY Right 01/15/2023   Procedure: OSTECTOMY COMPLETE MET HEAD RESECTION 3 TOES RIGHT FOOT; EXOSTECTOMY 1 TOE RIGHT FOOT;  Surgeon: Floyce Hutching, DPM;  Location: [facility] ORS;  Service: Orthopedics/Podiatry;  Laterality: Right;    Family History  Problem Relation Age of Onset   Breast cancer Mother    Mitral valve prolapse Sister     Social History   Tobacco Use   Smoking status: Never   Smokeless tobacco: Never  Vaping Use   Vaping status: Never Used  Substance Use Topics   Alcohol use: Not Currently   Drug use: Not Currently    ROS Refer to HPI for ROS details.  Objective:    Vitals: BP 118/69 (BP Location: Right Arm)   Pulse 74   Temp 97.6 F (36.4 C) (Oral)   Resp 17   SpO2 98%   Physical Exam Vitals and nursing note reviewed.  Constitutional:      General: She  is not in acute distress.    Appearance: She is well-developed. She is not ill-appearing or toxic-appearing.  HENT:     Head: Normocephalic and atraumatic.  Cardiovascular:     Rate and Rhythm: Regular rhythm. Bradycardia present.     Heart sounds: Normal heart sounds. No murmur heard. Pulmonary:     Effort: Pulmonary effort is normal. No respiratory distress.     Breath sounds: Normal breath sounds. No wheezing, rhonchi or rales.  Chest:     Chest wall: No tenderness.  Musculoskeletal:        General: Normal range of motion.     Right shoulder: No swelling, tenderness, bony tenderness or crepitus. Normal range of motion. Normal strength. Normal pulse.     Right forearm:  No swelling, edema, deformity, tenderness or bony tenderness.  Skin:    General: Skin is warm and dry.  Neurological:     General: No focal deficit present.     Mental Status: She is alert and oriented to person, place, and time.  Psychiatric:        Mood and Affect: Mood normal.        Behavior: Behavior normal.     Procedures  No results found for this or any previous visit (from the past 24 hours).  Assessment and Plan :     Discharge Instructions       1. Right forearm pain (Primary) - ED EKG performed in UC shows sinus bradycardia, ventricular rate 58 bpm, no STEMI - aspirin  chewable tablet 243 mg given in UC for past history of MI right arm discomfort x 1 week.  Patient has already taken 181 mg aspirin  plus morning -Tried taking 400 to 600 mg of ibuprofen 2-3 times daily as needed for pain and right forearm to see if symptoms improve. - Based on past history of of MI with cardiac stent placed recommend follow-up with cardiology for further evaluation management.  -Based on the modified HEART score of 3 indicating a 0.9 to 1.7% risk of MACE, minimal concern for acute cardiac syndrome as cause of right arm pain. - If you develop any abnormal symptoms such as severe right shoulder/arm pain, chest pain,  shortness of breath, dizziness, weakness, diaphoresis follow-up in ER immediately for further evaluation and treatment.    Murry Khiev B Sarahy Creedon   Quintavius Niebuhr, [place], [place] 07/30/23 1001

## 2024-01-09 ENCOUNTER — Other Ambulatory Visit: Payer: Self-pay | Admitting: Cardiology

## 2024-01-13 ENCOUNTER — Other Ambulatory Visit: Payer: Self-pay

## 2024-01-13 MED ORDER — METOPROLOL SUCCINATE ER 25 MG PO TB24: 12.5000 mg | ORAL_TABLET | Freq: Every day | ORAL | 0 refills | Status: AC

## 2024-03-09 ENCOUNTER — Telehealth: Payer: Self-pay | Admitting: Cardiology

## 2024-03-09 NOTE — Telephone Encounter (Signed)
 Patient would like to be seeing at the office closer to there home. Please advise

## 2024-03-09 NOTE — Telephone Encounter (Signed)
Okay.   ST
